# Patient Record
Sex: Male | Born: 1965 | Race: Black or African American | Hispanic: No | Marital: Single | Smoking: Never smoker
Health system: Southern US, Community
[De-identification: ages and names within clinical notes are randomized; demographics above are authoritative.]

## PROBLEM LIST (undated history)

## (undated) DIAGNOSIS — E785 Hyperlipidemia, unspecified: Secondary | ICD-10-CM

## (undated) DIAGNOSIS — I10 Essential (primary) hypertension: Secondary | ICD-10-CM

## (undated) DIAGNOSIS — R590 Localized enlarged lymph nodes: Secondary | ICD-10-CM

## (undated) DIAGNOSIS — F1490 Cocaine use, unspecified, uncomplicated: Secondary | ICD-10-CM

## (undated) DIAGNOSIS — F1010 Alcohol abuse, uncomplicated: Secondary | ICD-10-CM

## (undated) HISTORY — DX: Essential (primary) hypertension: I10

---

## 2011-04-04 ENCOUNTER — Emergency Department (HOSPITAL_COMMUNITY)
Admission: EM | Admit: 2011-04-04 | Discharge: 2011-04-04 | Disposition: A | Discharge status: Home / Self Care | Payer: Self-pay | Attending: Emergency Medicine | Admitting: Emergency Medicine

## 2011-04-04 DIAGNOSIS — J029 Acute pharyngitis, unspecified: Secondary | ICD-10-CM | POA: Insufficient documentation

## 2011-04-04 DIAGNOSIS — R059 Cough, unspecified: Secondary | ICD-10-CM | POA: Insufficient documentation

## 2011-04-04 DIAGNOSIS — R05 Cough: Secondary | ICD-10-CM | POA: Insufficient documentation

## 2011-04-04 DIAGNOSIS — R6889 Other general symptoms and signs: Secondary | ICD-10-CM | POA: Insufficient documentation

## 2012-11-22 ENCOUNTER — Telehealth: Payer: Self-pay | Admitting: Internal Medicine

## 2012-11-22 ENCOUNTER — Encounter (HOSPITAL_COMMUNITY): Payer: Self-pay | Admitting: *Deleted

## 2012-11-22 ENCOUNTER — Emergency Department (HOSPITAL_COMMUNITY)
Admission: EM | Admit: 2012-11-22 | Discharge: 2012-11-22 | Disposition: A | Discharge status: Home / Self Care | Payer: Self-pay | Attending: Emergency Medicine | Admitting: Emergency Medicine

## 2012-11-22 ENCOUNTER — Encounter: Payer: Self-pay | Admitting: Oncology

## 2012-11-22 ENCOUNTER — Emergency Department (HOSPITAL_COMMUNITY): Payer: Self-pay

## 2012-11-22 DIAGNOSIS — R079 Chest pain, unspecified: Secondary | ICD-10-CM | POA: Insufficient documentation

## 2012-11-22 DIAGNOSIS — R591 Generalized enlarged lymph nodes: Secondary | ICD-10-CM

## 2012-11-22 DIAGNOSIS — R141 Gas pain: Secondary | ICD-10-CM | POA: Insufficient documentation

## 2012-11-22 DIAGNOSIS — R599 Enlarged lymph nodes, unspecified: Secondary | ICD-10-CM | POA: Insufficient documentation

## 2012-11-22 DIAGNOSIS — R142 Eructation: Secondary | ICD-10-CM | POA: Insufficient documentation

## 2012-11-22 LAB — CBC
MCV: 82 fL (ref 78.0–100.0)
Platelets: 202 10*3/uL (ref 150–400)
RBC: 4.23 MIL/uL (ref 4.22–5.81)
WBC: 4 10*3/uL (ref 4.0–10.5)

## 2012-11-22 LAB — COMPREHENSIVE METABOLIC PANEL
ALT: 68 U/L — ABNORMAL HIGH (ref 0–53)
AST: 106 U/L — ABNORMAL HIGH (ref 0–37)
Alkaline Phosphatase: 59 U/L (ref 39–117)
CO2: 22 mEq/L (ref 19–32)
Chloride: 108 mEq/L (ref 96–112)
GFR calc non Af Amer: 82 mL/min — ABNORMAL LOW (ref >90)
Potassium: 3.3 mEq/L — ABNORMAL LOW (ref 3.5–5.1)
Sodium: 140 mEq/L (ref 135–145)
Total Bilirubin: 0.4 mg/dL (ref 0.3–1.2)

## 2012-11-22 LAB — POCT I-STAT TROPONIN I: Troponin i, poc: 0.02 ng/mL (ref 0.00–0.08)

## 2012-11-22 MED ORDER — GI COCKTAIL ~~LOC~~
30.0000 mL | Freq: Once | ORAL | Status: AC
  Administered 2012-11-22: 30 mL via ORAL
  Filled 2012-11-22: qty 30

## 2012-11-22 MED ORDER — ASPIRIN 81 MG PO CHEW: 324.0000 mg | CHEWABLE_TABLET | Freq: Once | ORAL | Status: DC

## 2012-11-22 MED ORDER — SODIUM CHLORIDE 0.9 % IV SOLN
1000.0000 mL | INTRAVENOUS | Status: DC
  Administered 2012-11-22: 1000 mL via INTRAVENOUS

## 2012-11-22 MED ORDER — IOHEXOL 300 MG/ML  SOLN
80.0000 mL | Freq: Once | INTRAMUSCULAR | Status: AC | PRN
  Administered 2012-11-22: 80 mL via INTRAVENOUS

## 2012-11-22 NOTE — Progress Notes (Signed)
Medical Oncology on call   Called by ED physician requesting assistance with outpatient evaluation and follow up of this 47 yo patient who has some mediastinal and hilar adenopathy by chest CT done thru ED today, does not have PCP, social worker assisting > 1 hour in ED not able to find any PCP options, patient stable for outpatient follow up per ED. Labs ok per ED. Radiology has suggested PET prior to considering biopsy, which ED cannot set up. He does not have diagnosis yet.    Due to reported lack of other options for management, I have asked [facility] new patient scheduler to try to set up new patient visit with any med onc MD in next few weeks and to let ED know. ED to keep patient there until they hear back re appointment.  L.Marg Macmaster,MD

## 2012-11-22 NOTE — Telephone Encounter (Signed)
Received note from Dr. Darrold Span stating to schedule a pt in the  ED a new pt appt. The np appt is 12/03/12@1 :30 with Dr. Arbutus Ped. The ED Dr  Will give pt time and date of appt. Dx- Hilar mediastinal adenopathy Mailed np packet

## 2012-11-22 NOTE — ED Notes (Signed)
Per EMS: pt coming from home with c/o bilateral chest pain, described as sharp, constant. Skin warm and dry, respirations equal and unlabored. Pain onset was during an unsuccessful bowel movement, inhalation increases pain. Pt now rate 1/10. Pt given 324 asa. Pt is A&Ox4.

## 2012-11-22 NOTE — Telephone Encounter (Signed)
C/D 11/22/12 for appt. 12/03/12

## 2012-11-22 NOTE — ED Provider Notes (Signed)
CT findings noted Pt will need f/u He denies known h/o cancer He denies wt loss, denies night sweats I have spoken to case management about arranging PCP followup and likely oncology referral  Pt agreeable with plan  Joya Gaskins, MD 11/22/12 [phone]

## 2012-11-22 NOTE — ED Provider Notes (Signed)
Date: 11/22/2012 0748am  Rate: 83  Rhythm: normal sinus rhythm  QRS Axis: normal  Intervals: normal  ST/T Wave abnormalities: nonspecific ST changes  Conduction Disutrbances:none  Narrative Interpretation:   Old EKG Reviewed: unchanged    I assumed care in signout Pt feels improved, no current CP Repeat EKG appears unchanged from prior His abdomen is soft, and he is resting comfortably Initial EDP felt patient should have CT chest Will obtain Ct chest, repeat troponin and if negative will be stable for d/c Pt denies pleuritic CP, doubt PE   Joya Gaskins, MD 11/22/12 [phone]

## 2012-11-22 NOTE — ED Provider Notes (Signed)
History    CSN: [id number] Arrival date & time 11/22/12  0102 First MD Initiated Contact with Patient 11/22/12 [phone]      Chief Complaint  Patient presents with  . Chest Pain    HPI Patient presents to emergency room with complaints of acute onset of chest pain this morning.  Patient states he started to feel gas, bloating in his abdomen. He then started having some sensation that he needed to burp. Following that he experienced severe sharp constant pain in his chest. Patient was given an aspirin the symptoms have improved. He denies any shortness of breath, nausea or vomiting. He denies any leg swelling. He denies any history of PE or DVT. There is no significant family history of heart disease. History reviewed. No pertinent past medical history.  No past surgical history on file.  No family history on file.  History  Substance Use Topics  . Smoking status: Not on file  . Smokeless tobacco: Not on file  . Alcohol Use: Not on file      Review of Systems  All other systems reviewed and are negative.    Allergies  Review of patient's allergies indicates no known allergies.  Home Medications  No current outpatient prescriptions on file.  BP 126/84  Pulse 81  Temp(Src) 97.7 F (36.5 C) (Oral)  Resp 25  SpO2 98%  Physical Exam  Nursing note and vitals reviewed. Constitutional: He appears well-developed and well-nourished. No distress.  HENT:  Head: Normocephalic and atraumatic.  Right Ear: External ear normal.  Left Ear: External ear normal.  Eyes: Conjunctivae are normal. Right eye exhibits no discharge. Left eye exhibits no discharge. No scleral icterus.  Neck: Neck supple. No tracheal deviation present.  Cardiovascular: Normal rate, regular rhythm and intact distal pulses.   Pulmonary/Chest: Effort normal and breath sounds normal. No stridor. No respiratory distress. He has no wheezes. He has no rales. He exhibits tenderness.  Abdominal: Soft. Bowel sounds are  normal. He exhibits no distension. There is no tenderness. There is no rebound and no guarding.  Musculoskeletal: He exhibits no edema and no tenderness.  Neurological: He is alert. He has normal strength. No sensory deficit. Cranial nerve deficit:  no gross defecits noted. He exhibits normal muscle tone. He displays no seizure activity. Coordination normal.  Skin: Skin is warm and dry. No rash noted.  Psychiatric: He has a normal mood and affect.    ED Course  Procedures (including critical care time) EKG Normal sinus rhythm Normal axis, normal intervals Borderline repolarization abnormality No prior EKG for comparison Labs Reviewed  CBC - Abnormal; Notable for the following:    Hemoglobin 11.9 (*)    HCT 34.7 (*)    All other components within normal limits  PROTIME-INR  APTT  COMPREHENSIVE METABOLIC PANEL  POCT I-STAT TROPONIN I   Dg Chest 2 View  11/22/2012   *RADIOLOGY REPORT*  Clinical Data: Chest pain  CHEST - 2 VIEW  Comparison: None.  Findings: Normal heart size and pulmonary vascularity.  Prominent nodular soft tissue opacities in the hilar regions bilaterally and in the left aortopulmonic window.  Changes suggest hilar lymphadenopathy.  CT is recommended for further evaluation.  No focal airspace consolidation in the lungs.  No blunting of costophrenic angles.  No pneumothorax.  IMPRESSION: Bilateral hilar prominence suggesting lymphadenopathy.  CT recommended for further evaluation.   Original Report Authenticated By: Burman Nieves, M.D.      MDM  Hilar lymphadenopathy noted on CXR.  Will  Ct to evaluate further .   Symptoms atypical for cardiac disease.  Will continue to monitor.       Celene Kras, MD 11/22/12 [phone]

## 2012-11-22 NOTE — ED Provider Notes (Signed)
F/u arranged on 6/24 with dr Shirline Frees with cancer center Pt informed of need for followup and possible cancer   Eric Gaskins, MD 11/22/12 [phone]

## 2012-12-03 ENCOUNTER — Telehealth: Payer: Self-pay | Admitting: Internal Medicine

## 2012-12-03 ENCOUNTER — Other Ambulatory Visit (HOSPITAL_BASED_OUTPATIENT_CLINIC_OR_DEPARTMENT_OTHER): Payer: Self-pay | Admitting: Lab

## 2012-12-03 ENCOUNTER — Ambulatory Visit: Payer: Self-pay

## 2012-12-03 ENCOUNTER — Encounter: Payer: Self-pay | Admitting: Internal Medicine

## 2012-12-03 ENCOUNTER — Ambulatory Visit (HOSPITAL_BASED_OUTPATIENT_CLINIC_OR_DEPARTMENT_OTHER): Payer: Self-pay | Admitting: Internal Medicine

## 2012-12-03 VITALS — BP 179/91 | HR 81 | Temp 98.3°F | Resp 18 | Ht 66.0 in | Wt 164.4 lb

## 2012-12-03 DIAGNOSIS — R599 Enlarged lymph nodes, unspecified: Secondary | ICD-10-CM

## 2012-12-03 DIAGNOSIS — R590 Localized enlarged lymph nodes: Secondary | ICD-10-CM | POA: Insufficient documentation

## 2012-12-03 LAB — COMPREHENSIVE METABOLIC PANEL (CC13)
ALT: 33 U/L (ref 0–55)
Albumin: 3.4 g/dL — ABNORMAL LOW (ref 3.5–5.0)
CO2: 26 mEq/L (ref 22–29)
Calcium: 8.9 mg/dL (ref 8.4–10.4)
Chloride: 105 mEq/L (ref 98–107)
Glucose: 105 mg/dl — ABNORMAL HIGH (ref 70–99)
Sodium: 139 mEq/L (ref 136–145)
Total Bilirubin: 0.35 mg/dL (ref 0.20–1.20)
Total Protein: 7.5 g/dL (ref 6.4–8.3)

## 2012-12-03 LAB — CBC WITH DIFFERENTIAL/PLATELET
Basophils Absolute: 0 10*3/uL (ref 0.0–0.1)
Eosinophils Absolute: 0.3 10*3/uL (ref 0.0–0.5)
HGB: 11.8 g/dL — ABNORMAL LOW (ref 13.0–17.1)
LYMPH%: 14.5 % (ref 14.0–49.0)
MCV: 84 fL (ref 79.3–98.0)
MONO#: 0.3 10*3/uL (ref 0.1–0.9)
MONO%: 6.5 % (ref 0.0–14.0)
NEUT#: 3.8 10*3/uL (ref 1.5–6.5)
Platelets: 236 10*3/uL (ref 140–400)
RDW: 12.6 % (ref 11.0–14.6)
WBC: 5.2 10*3/uL (ref 4.0–10.3)

## 2012-12-03 LAB — LACTATE DEHYDROGENASE (CC13): LDH: 186 U/L (ref 125–245)

## 2012-12-03 NOTE — Patient Instructions (Signed)
I will order a PET scan for further evaluation of the enlarged lymph nodes in your chest. Followup visit in 2 weeks.

## 2012-12-03 NOTE — Progress Notes (Signed)
Checked in new patient .didn't ask for POA/living will. He has no insurance, so I have him the ph#/flyer for checking for insurance. He wants communication via email.

## 2012-12-03 NOTE — Progress Notes (Signed)
[facility] [phone]   Fax:([phone]  CONSULT NOTE  REFERRING PHYSICIAN: Dr. Zadie Rhine  REASON FOR CONSULTATION:  47 years old African American male with mediastinal lymphadenopathy.  HPI Eric Thornton is a 47 y.o. male with no significant past medical history except for hypertension and dyslipidemia as well as history of alcohol and drug abuse. The patient presented to the emergency Department at [facility] on 11/22/2012 complaining of bilateral lower rib cage pain. During his evaluation and chest x-ray was performed and showed bilateral hilar prominence suggesting lymphadenopathy. This was followed by CT scan of the chest on 11/22/2012 and it showed perihilar lymphadenopathy which accounts for the plain film abnormality. Right hilar lymph node measures 18  mm. Left hilar lymph node conglomerate measures 19 mm. Right suprahilar lymph node measures 25 mm. Smaller lower paratracheal lymph nodes measure 14 mm. Subcarinal lymph node measures 21 mm. No supraclavicular lymphadenopathy identified. No axillary lymphadenopathy. Review of the lung parenchyma demonstrates no subpleural nodularity. Mild atelectasis at the right lung base. He was given aspirin at the emergency Department with improvement in his condition. He was referred to me today for evaluation and recommendation regarding the mediastinal lymphadenopathy. The patient denied having any significant shortness of breath, cough or hemoptysis. He has no more chest pain. He denied having any significant weight loss or night sweats. He has no nausea or vomiting, no fever or chills. Today he has no complaints. His family history unremarkable for any malignancy except for a sister.   PAST MEDICAL HISTORY: Significant only for hypertension and dyslipidemia. The patient denied having any history of diabetes mellitus, coronary arteries diseases or stroke.  FAMILY HISTORY: Mother had thyroid  disease, father had cardiac arrest and a sister with history of cancer.   Social History History  Substance Use Topics  . Smoking status: Never Smoker   . Smokeless tobacco: Not on file  . Alcohol Use: 0.6 oz/week    1 Cans of beer, 0 Shots of liquor, 0 Drinks containing 0.5 oz of alcohol per week   The patient is single and has no children. He works in the [employer] He smokes a few cigarettes every now and then. The patient has a history of alcohol abuse but quit 20 years ago. He also has a history of cocaine abuse last time he used it was last week.  Allergies: NKDA  CURRENT MEDICATION: None.   Review of Systems  A comprehensive review of systems was negative.  Physical Exam  KVQ:QVZDG, healthy, no distress, well nourished and well developed SKIN: skin color, texture, turgor are normal HEAD: Normocephalic, No masses, lesions, tenderness or abnormalities EYES: normal, PERRLA EARS: External ears normal OROPHARYNX:no exudate and no erythema  NECK: supple, no adenopathy LYMPH:  no palpable lymphadenopathy, no hepatosplenomegaly LUNGS: clear to auscultation  HEART: regular rate & rhythm, no murmurs and no gallops ABDOMEN:abdomen soft, non-tender, normal bowel sounds and no masses or organomegaly BACK: Back symmetric, no curvature. EXTREMITIES:no joint deformities, effusion, or inflammation, no edema, no skin discoloration  NEURO: alert & oriented x 3 with fluent speech, no focal motor/sensory deficits  PERFORMANCE STATUS: ECOG 0  LABORATORY DATA: Lab Results  Component Value Date   WBC 5.2 12/03/2012   HGB 11.8* 12/03/2012   HCT 35.8* 12/03/2012   MCV 84.0 12/03/2012   PLT 236 12/03/2012      Chemistry      Component Value Date/Time   NA 139 12/03/2012 1348  NA 140 11/22/2012 0620   K 3.7 12/03/2012 1348   K 3.3* 11/22/2012 0620   CL 105 12/03/2012 1348   CL 108 11/22/2012 0620   CO2 26 12/03/2012 1348   CO2 22 11/22/2012 0620   BUN 10.3 12/03/2012 1348   BUN 12  11/22/2012 0620   CREATININE 1.2 12/03/2012 1348   CREATININE 1.05 11/22/2012 0620      Component Value Date/Time   CALCIUM 8.9 12/03/2012 1348   CALCIUM 8.3* 11/22/2012 0620   ALKPHOS 75 12/03/2012 1348   ALKPHOS 59 11/22/2012 0620   AST 18 12/03/2012 1348   AST 106* 11/22/2012 0620   ALT 33 12/03/2012 1348   ALT 68* 11/22/2012 0620   BILITOT 0.35 12/03/2012 1348   BILITOT 0.4 11/22/2012 0620       RADIOGRAPHIC STUDIES: Dg Chest 2 View  11/22/2012   *RADIOLOGY REPORT*  Clinical Data: Chest pain  CHEST - 2 VIEW  Comparison: None.  Findings: Normal heart size and pulmonary vascularity.  Prominent nodular soft tissue opacities in the hilar regions bilaterally and in the left aortopulmonic window.  Changes suggest hilar lymphadenopathy.  CT is recommended for further evaluation.  No focal airspace consolidation in the lungs.  No blunting of costophrenic angles.  No pneumothorax.  IMPRESSION: Bilateral hilar prominence suggesting lymphadenopathy.  CT recommended for further evaluation.   Original Report Authenticated By: Burman Nieves, M.D.   Ct Chest W Contrast  11/22/2012   *RADIOLOGY REPORT*  Clinical Data: Chest pain, questionable lymphadenopathy Chest radiograph  CT CHEST WITH CONTRAST  Technique:  Multidetector CT imaging of the chest was performed following the standard protocol during bolus administration of intravenous contrast.  Contrast: 80mL OMNIPAQUE IOHEXOL 300 MG/ML  SOLN  Comparison: Chest radiograph 11/22/2012  Findings: There is indeed perihilar lymphadenopathy which accounts for the plain film abnormality.  Right hilar lymph node measures 18 mm (image 30).  Left hilar lymph node conglomerate measures 19 mm (image 29).  Right suprahilar lymph node measures 25 mm.  Smaller lower paratracheal lymph nodes measure 14 mm.  Subcarinal lymph node measures 21 mm.  No supraclavicular lymphadenopathy identified.  No axillary lymphadenopathy.  Review of the lung parenchyma demonstrates no subpleural  nodularity.  Mild atelectasis at the right lung base.  Airways are normal.  Limited view of the upper abdomen partially images the spleen which appears normal.  Adrenal glands are normal.  Partially imaged kidneys appear normal.  Limited view of the skeleton is unremarkable.  IMPRESSION:  1.  Symmetric hilar lymphadenopathy and mediastinal lymphadenopathy.  Differential includes a lymphoproliferative disorder such as lymphoma versus granulomatous disease (sarcoidosis). There are no pulmonary findings to suggest sarcoidosis. 2.  Spleen appears normal. 3.  Consider outpatient FDG PET scan for biopsy guidance and potential staging.   Original Report Authenticated By: Genevive Bi, M.D.    ASSESSMENT: This is a very pleasant 47 years old [place] American male presented with mediastinal lymphadenopathy seen on recent CT scan of the chest. The pathology is unclear at this point but this could be presentation for sarcoidosis, lymphoma or lung cancer.   PLAN: I have a lengthy discussion with the patient today about his current condition. I showed them the images of the CT scan of the chest and the mediastinal lymphadenopathy. The patient is currently asymptomatic. I recommended for him to have a PET scan performed for further evaluation of these mediastinal lymphadenopathy. If the PET scan is suspicious, I would consider referring the patient to cardiothoracic surgery or pulmonary  for tissue diagnosis. I would see the patient back for followup visit in 2 weeks for evaluation and discussion of his PET scan results. He was advised to call immediately if he has any concerning symptoms in the interval.  All questions were answered. The patient knows to call the clinic with any problems, questions or concerns. We can certainly see the patient much sooner if necessary.  Thank you so much for allowing me to participate in the care of Eric Thornton. I will continue to follow up the patient with you and assist  in his care.  I spent 30 minutes counseling the patient face to face. The total time spent in the appointment was 50 minutes.  Sully Manzi K. 12/03/2012, 5:54 PM

## 2012-12-03 NOTE — Telephone Encounter (Signed)
s.w. pt and advised on PET and 7.8.14 appt .Marland Kitchen..pt ok and aware

## 2012-12-09 ENCOUNTER — Encounter (HOSPITAL_COMMUNITY): Payer: Self-pay

## 2012-12-16 ENCOUNTER — Encounter (HOSPITAL_COMMUNITY)
Admission: RE | Admit: 2012-12-16 | Discharge: 2012-12-16 | Disposition: A | Discharge status: Home / Self Care | Payer: Self-pay | Source: Ambulatory Visit | Attending: Internal Medicine | Admitting: Internal Medicine

## 2012-12-16 ENCOUNTER — Encounter (HOSPITAL_COMMUNITY): Payer: Self-pay

## 2012-12-16 DIAGNOSIS — R599 Enlarged lymph nodes, unspecified: Secondary | ICD-10-CM | POA: Insufficient documentation

## 2012-12-16 DIAGNOSIS — R590 Localized enlarged lymph nodes: Secondary | ICD-10-CM

## 2012-12-16 LAB — GLUCOSE, CAPILLARY: Glucose-Capillary: 91 mg/dL (ref 70–99)

## 2012-12-16 MED ORDER — FLUDEOXYGLUCOSE F - 18 (FDG) INJECTION
19.4000 | Freq: Once | INTRAVENOUS | Status: AC | PRN
  Administered 2012-12-16: 19.4 via INTRAVENOUS

## 2012-12-17 ENCOUNTER — Ambulatory Visit: Payer: Self-pay | Admitting: Internal Medicine

## 2012-12-24 ENCOUNTER — Telehealth: Payer: Self-pay | Admitting: *Deleted

## 2012-12-24 NOTE — Telephone Encounter (Signed)
NOTIFIED DR.MOHAMED'S NURSE, STEPHANIE JOHNSON,RN.

## 2012-12-26 ENCOUNTER — Ambulatory Visit (HOSPITAL_BASED_OUTPATIENT_CLINIC_OR_DEPARTMENT_OTHER): Payer: Self-pay | Admitting: Internal Medicine

## 2012-12-26 ENCOUNTER — Encounter: Payer: Self-pay | Admitting: Internal Medicine

## 2012-12-26 ENCOUNTER — Telehealth: Payer: Self-pay | Admitting: Internal Medicine

## 2012-12-26 VITALS — BP 141/93 | HR 63 | Temp 97.6°F | Resp 18 | Ht 66.0 in | Wt 160.9 lb

## 2012-12-26 DIAGNOSIS — R590 Localized enlarged lymph nodes: Secondary | ICD-10-CM

## 2012-12-26 DIAGNOSIS — R599 Enlarged lymph nodes, unspecified: Secondary | ICD-10-CM

## 2012-12-26 NOTE — Progress Notes (Signed)
[facility] Telephone:(336) [phone]   Fax:([phone]  OFFICE PROGRESS NOTE  No PCP Per Patient [address] [place] [address]  DIAGNOSIS: Lymphadenopathy suspicious for lymphoma.  PRIOR THERAPY: None  CURRENT THERAPY: None  INTERVAL HISTORY: Eric Thornton 47 y.o. male returns to the clinic today for followup visit. The patient is feeling fine today with no specific complaints. He denied having any significant fatigue or weakness. He denied having any chest pain, shortness breath, cough or hemoptysis. The patient has no nausea or vomiting. He was seen recently for mediastinal lymphadenopathy and that ordered a PET scan for further evaluation of his disease. The patient is here today for evaluation and discussion of his scan results.  ALLERGIES:  has No Known Allergies.  MEDICATIONS:  No current outpatient prescriptions on file.   No current facility-administered medications for this visit.    REVIEW OF SYSTEMS:  A comprehensive review of systems was negative.   PHYSICAL EXAMINATION: General appearance: alert, cooperative and no distress Head: Normocephalic, without obvious abnormality, atraumatic Neck: no adenopathy and thyroid not enlarged, symmetric, no tenderness/mass/nodules Lymph nodes: Cervical, supraclavicular, and axillary nodes normal. Resp: clear to auscultation bilaterally Cardio: regular rate and rhythm, S1, S2 normal, no murmur, click, rub or gallop GI: soft, non-tender; bowel sounds normal; no masses,  no organomegaly Extremities: extremities normal, atraumatic, no cyanosis or edema  ECOG PERFORMANCE STATUS: 1 - Symptomatic but completely ambulatory  Blood pressure 141/93, pulse 63, temperature 97.6 F (36.4 C), temperature source Oral, resp. rate 18, height 5\' 6"  (1.676 m), weight 160 lb 14.4 oz (72.984 kg).  LABORATORY DATA: Lab Results  Component Value Date   WBC 5.2 12/03/2012   HGB 11.8* 12/03/2012   HCT 35.8* 12/03/2012    MCV 84.0 12/03/2012   PLT 236 12/03/2012      Chemistry      Component Value Date/Time   NA 139 12/03/2012 1348   NA 140 11/22/2012 0620   K 3.7 12/03/2012 1348   K 3.3* 11/22/2012 0620   CL 105 12/03/2012 1348   CL 108 11/22/2012 0620   CO2 26 12/03/2012 1348   CO2 22 11/22/2012 0620   BUN 10.3 12/03/2012 1348   BUN 12 11/22/2012 0620   CREATININE 1.2 12/03/2012 1348   CREATININE 1.05 11/22/2012 0620      Component Value Date/Time   CALCIUM 8.9 12/03/2012 1348   CALCIUM 8.3* 11/22/2012 0620   ALKPHOS 75 12/03/2012 1348   ALKPHOS 59 11/22/2012 0620   AST 18 12/03/2012 1348   AST 106* 11/22/2012 0620   ALT 33 12/03/2012 1348   ALT 68* 11/22/2012 0620   BILITOT 0.35 12/03/2012 1348   BILITOT 0.4 11/22/2012 0620       RADIOGRAPHIC STUDIES: Nm Pet Image Initial (pi) Skull Base To Thigh  12/16/2012   *RADIOLOGY REPORT*  Clinical Data: Initial treatment strategy for mediastinal lymphadenopathy. Evaluate for lung cancer versus lymphoma versus sarcoid.  NUCLEAR MEDICINE PET SKULL BASE TO THIGH  Fasting Blood Glucose:  91  Technique:  19.4 mCi F-18 FDG was injected intravenously.  CT data was obtained and used for attenuation correction and anatomic localization only.  (This was not acquired as a diagnostic CT examination.) Additional exam technical data entered on technologist worksheet.  Comparison:  CT chest dated 11/22/2012  Findings:  Neck: Enlarged bilateral cervical lymph nodes, including: --12 mm short-axis left level IB node (series 2/image 33) --11 mm short-axis right level IB node (series  2/image 37) --7 mm short-axis right level IIA node (series 2/image 28), max SUV 5.1  Diffuse uptake within the nasal cavity, nasopharynx, bilateral parotid, and bilateral submandibular glands, likely physiologic/reactive.  Chest:  No suspicious pulmonary nodules on the CT scan.  No pulmonary findings to suggest sarcoidosis.  Extensive thoracic lymphadenopathy, including: --14 mm short-axis left axillary node  (series 2/image 63), max SUV 6.8 --13 mm short-axis right axillary node (series 2/image 69), max SUV 4.0) --15 mm short-axis prevascular node (series 2/image 71), max SUV 8.3 --Right hilar lymphadenopathy, max SUV 8.2 (PET image 72) --15 mm short-axis right subcarinal/lower lobe node (series 2/image 81), max SUV 8.4  Abdomen/Pelvis:  No abnormal hypermetabolic activity within the liver, pancreas, adrenal glands, or spleen.  Enlarged pelvic lymph nodes, including: --10 mm short-axis right deep inguinal node (series 2/image 192), max SUV 4.6 --14 mm short-axis left external iliac node (series 2/image 198), max SUV 6.8 --17 mm short-axis right lower inguinal node (series 2/image 219), max SUV 10.1 --13 mm short-axis left lower inguinal node (series 2/image 224), max SUV 11.3  Skeleton:  No focal hypermetabolic activity to suggest skeletal metastasis.  IMPRESSION: Hypermetabolic cervical, thoracic, and pelvic lymphadenopathy, as described above, max SUV 11.3 in the left lower inguinal region.  No pulmonary findings to suggest primary bronchogenic neoplasm or sarcoidosis.  The overall appearance is worrisome for lymphoproliferative disorder such as lymphoma.  Percutaneous biopsy is suggested.   Original Report Authenticated By: Charline Bills, M.D.    ASSESSMENT AND PLAN: This is a very pleasant 47 years old African American male with generalized lymphadenopathy suspicious for lymphoma. I discussed the PET scan results with the patient and recommended for him referral to Dr. Dwain Sarna for consideration of excisional biopsy of one of the enlarged inguinal lymph nodes. I would see him back for followup visit in one month for evaluation and discussion of his biopsy results as well as treatment options. The patient agreed to the current plan He was advised to call immediately if he has any concerning symptoms in the interval.   All questions were answered. The patient knows to call the clinic with any problems,  questions or concerns. We can certainly see the patient much sooner if necessary.

## 2012-12-26 NOTE — Telephone Encounter (Signed)
Gave pt appt for lab and ML in August and see Dr. Dwain Sarna @ [facility] on 7/28, pt to bring payment since he did not have insurance

## 2012-12-28 NOTE — Patient Instructions (Signed)
PET scan showed generalized lymphadenopathy suspicious for lymphoma. I will refer to surgery for consideration of excisional biopsy of one of the lymph nodes. Followup visit in one month

## 2013-01-02 ENCOUNTER — Telehealth: Payer: Self-pay | Admitting: Medical Oncology

## 2013-01-02 NOTE — Telephone Encounter (Signed)
Errick called and asked me to contact his sister Senan Urey , [phone] ( she is in [place]) and give her access to his medical information and for what Dr Arbutus Ped is seeing him . I called Renee and gave her information .

## 2013-01-06 ENCOUNTER — Ambulatory Visit (INDEPENDENT_AMBULATORY_CARE_PROVIDER_SITE_OTHER): Payer: Self-pay | Admitting: General Surgery

## 2013-01-06 ENCOUNTER — Telehealth (INDEPENDENT_AMBULATORY_CARE_PROVIDER_SITE_OTHER): Payer: Self-pay | Admitting: General Surgery

## 2013-01-06 ENCOUNTER — Encounter (INDEPENDENT_AMBULATORY_CARE_PROVIDER_SITE_OTHER): Payer: Self-pay | Admitting: General Surgery

## 2013-01-06 VITALS — BP 130/78 | HR 82 | Temp 97.4°F | Resp 16 | Ht 65.5 in | Wt 157.4 lb

## 2013-01-06 DIAGNOSIS — R599 Enlarged lymph nodes, unspecified: Secondary | ICD-10-CM

## 2013-01-06 DIAGNOSIS — R590 Localized enlarged lymph nodes: Secondary | ICD-10-CM

## 2013-01-06 NOTE — Progress Notes (Signed)
Patient ID: Eric Thornton, male   DOB: 1965/10/07, 47 y.o.   MRN: [id number]  Chief Complaint  Patient presents with  . New Evaluation    eval cervical lymphadenopathy    HPI Eric Thornton is a 47 y.o. male.  Referred by Dr Arbutus Ped HPI 64 yom who went to ER with bilateral rib pain and was found to have hilar adenopathy.  He has undergone evaluation by Dr Arbutus Ped and is found to have several sites of adenopathy.  The rib pain is resolved. He has no other symptoms.  I went through ROS with him.  Specifically he does not notice any masses, no weight loss, no night sweats, no loss of appetite.  Has fh of sister with unknown cancer and gm with breast cancer.  Past Medical History  Diagnosis Date  . Hypertension     History reviewed. No pertinent past surgical history.  Family History  Problem Relation Age of Onset  . Cancer Sister     Social History History  Substance Use Topics  . Smoking status: Never Smoker   . Smokeless tobacco: Never Used  . Alcohol Use: 0.6 oz/week    1 Cans of beer, 0 Shots of liquor, 0 Drinks containing 0.5 oz of alcohol per week     Comment: last one month ago    No Known Allergies  No current outpatient prescriptions on file.   No current facility-administered medications for this visit.    Review of Systems Review of Systems  Constitutional: Negative for fever, chills and unexpected weight change.  HENT: Negative for hearing loss, congestion, sore throat, trouble swallowing and voice change.   Eyes: Negative for visual disturbance.  Respiratory: Negative for cough and wheezing.   Cardiovascular: Negative for chest pain, palpitations and leg swelling.  Gastrointestinal: Negative for nausea, vomiting, abdominal pain, diarrhea, constipation, blood in stool, abdominal distention, anal bleeding and rectal pain.  Genitourinary: Negative for hematuria and difficulty urinating.  Musculoskeletal: Negative for arthralgias.  Skin: Negative for rash  and wound.  Neurological: Negative for seizures, syncope, weakness and headaches.  Hematological: Negative for adenopathy. Does not bruise/bleed easily.  Psychiatric/Behavioral: Negative for confusion.    Blood pressure 130/78, pulse 82, temperature 97.4 F (36.3 C), temperature source Temporal, resp. rate 16, height 5' 5.5" (1.664 m), weight 157 lb 6.4 oz (71.396 kg).  Physical Exam Physical Exam  Vitals reviewed. Constitutional: He appears well-developed and well-nourished.  Cardiovascular: Normal rate, regular rhythm and normal heart sounds.   Pulmonary/Chest: Effort normal and breath sounds normal. He has no wheezes. He has no rales.  Lymphadenopathy:    He has no cervical adenopathy.       Right: Inguinal adenopathy present.       Left: Inguinal adenopathy present.    Data Reviewed NUCLEAR MEDICINE PET SKULL BASE TO THIGH  Fasting Blood Glucose: 91  Technique: 19.4 mCi F-18 FDG was injected intravenously. CT data  was obtained and used for attenuation correction and anatomic  localization only. (This was not acquired as a diagnostic CT  examination.) Additional exam technical data entered on  technologist worksheet.  Comparison: CT chest dated 11/22/2012  Findings:  Neck: Enlarged bilateral cervical lymph nodes, including:  --12 mm short-axis left level IB node (series 2/image 33)  --11 mm short-axis right level IB node (series 2/image 37)  --7 mm short-axis right level IIA node (series 2/image 28), max SUV  5.1  Diffuse uptake within the nasal cavity, nasopharynx, bilateral  parotid, and bilateral submandibular  glands, likely  physiologic/reactive.  Chest: No suspicious pulmonary nodules on the CT scan. No  pulmonary findings to suggest sarcoidosis.  Extensive thoracic lymphadenopathy, including:  --14 mm short-axis left axillary node (series 2/image 63), max SUV  6.8  --13 mm short-axis right axillary node (series 2/image 69), max SUV  4.0)  --15 mm short-axis  prevascular node (series 2/image 71), max SUV  8.3  --Right hilar lymphadenopathy, max SUV 8.2 (PET image 72)  --15 mm short-axis right subcarinal/lower lobe node (series 2/image  81), max SUV 8.4  Abdomen/Pelvis: No abnormal hypermetabolic activity within the  liver, pancreas, adrenal glands, or spleen.  Enlarged pelvic lymph nodes, including:  --10 mm short-axis right deep inguinal node (series 2/image 192),  max SUV 4.6  --14 mm short-axis left external iliac node (series 2/image 198),  max SUV 6.8  --17 mm short-axis right lower inguinal node (series 2/image 219),  max SUV 10.1  --13 mm short-axis left lower inguinal node (series 2/image 224),  max SUV 11.3  Skeleton: No focal hypermetabolic activity to suggest skeletal  metastasis.  IMPRESSION:  Hypermetabolic cervical, thoracic, and pelvic lymphadenopathy, as  described above, max SUV 11.3 in the left lower inguinal region.  No pulmonary findings to suggest primary bronchogenic neoplasm or  sarcoidosis.  The overall appearance is worrisome for lymphoproliferative  disorder such as lymphoma. Percutaneous biopsy is suggested.    Assessment    Multiple sites of lymphadenopathy    Plan    Right inguinal node excisional biopsy     I discussed with him a right inguinal node excisional biopsy. This is the most accessible place as I can palpate this node. He has no other real symptoms associated with this right now. I think due to the appearance of a PET scan as well as a multiple size it is reasonable to consider this. I discussed doing this under general anesthesia. I discussed bleeding, infection, postoperative lymphatic leak as the risks involved with this procedure. We will plan on doing this as soon as we can.   Deedra Pro 01/06/2013, 10:44 AM

## 2013-01-06 NOTE — Telephone Encounter (Signed)
Will wait to hear back from patient. I recommended right inguinal node biopsy

## 2013-01-06 NOTE — Telephone Encounter (Signed)
Advised patient of financial responsibility, per patient he will call back to schedule

## 2013-01-28 ENCOUNTER — Other Ambulatory Visit: Payer: Self-pay | Admitting: Lab

## 2013-01-28 ENCOUNTER — Ambulatory Visit: Payer: Self-pay | Admitting: Internal Medicine

## 2015-11-30 ENCOUNTER — Emergency Department (HOSPITAL_COMMUNITY): Payer: No Typology Code available for payment source

## 2015-11-30 ENCOUNTER — Encounter (HOSPITAL_COMMUNITY): Payer: Self-pay

## 2015-11-30 ENCOUNTER — Emergency Department (HOSPITAL_COMMUNITY)
Admission: EM | Admit: 2015-11-30 | Discharge: 2015-11-30 | Disposition: A | Discharge status: Home / Self Care | Payer: No Typology Code available for payment source | Attending: Emergency Medicine | Admitting: Emergency Medicine

## 2015-11-30 DIAGNOSIS — Y999 Unspecified external cause status: Secondary | ICD-10-CM | POA: Insufficient documentation

## 2015-11-30 DIAGNOSIS — Y92410 Unspecified street and highway as the place of occurrence of the external cause: Secondary | ICD-10-CM | POA: Insufficient documentation

## 2015-11-30 DIAGNOSIS — I10 Essential (primary) hypertension: Secondary | ICD-10-CM | POA: Insufficient documentation

## 2015-11-30 DIAGNOSIS — S46911A Strain of unspecified muscle, fascia and tendon at shoulder and upper arm level, right arm, initial encounter: Secondary | ICD-10-CM | POA: Insufficient documentation

## 2015-11-30 DIAGNOSIS — Y9389 Activity, other specified: Secondary | ICD-10-CM | POA: Diagnosis not present

## 2015-11-30 DIAGNOSIS — M25511 Pain in right shoulder: Secondary | ICD-10-CM | POA: Diagnosis present

## 2015-11-30 MED ORDER — NAPROXEN 500 MG PO TABS: 500.0000 mg | ORAL_TABLET | Freq: Two times a day (BID) | ORAL | Status: DC | PRN
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
## 2018-03-12 ENCOUNTER — Encounter (HOSPITAL_COMMUNITY): Payer: Self-pay | Admitting: Emergency Medicine

## 2018-03-12 ENCOUNTER — Other Ambulatory Visit: Payer: Self-pay

## 2018-03-12 ENCOUNTER — Inpatient Hospital Stay (HOSPITAL_COMMUNITY)
Admission: EM | Admit: 2018-03-12 | Discharge: 2018-03-15 | DRG: 419 | Disposition: A | Discharge status: Home / Self Care | Payer: Self-pay | Attending: Internal Medicine | Admitting: Internal Medicine

## 2018-03-12 ENCOUNTER — Emergency Department (HOSPITAL_COMMUNITY): Payer: Self-pay

## 2018-03-12 DIAGNOSIS — R590 Localized enlarged lymph nodes: Secondary | ICD-10-CM | POA: Diagnosis present

## 2018-03-12 DIAGNOSIS — I10 Essential (primary) hypertension: Secondary | ICD-10-CM

## 2018-03-12 DIAGNOSIS — I200 Unstable angina: Secondary | ICD-10-CM

## 2018-03-12 DIAGNOSIS — K810 Acute cholecystitis: Secondary | ICD-10-CM

## 2018-03-12 DIAGNOSIS — R52 Pain, unspecified: Secondary | ICD-10-CM

## 2018-03-12 DIAGNOSIS — I160 Hypertensive urgency: Secondary | ICD-10-CM | POA: Diagnosis present

## 2018-03-12 DIAGNOSIS — K8020 Calculus of gallbladder without cholecystitis without obstruction: Secondary | ICD-10-CM | POA: Diagnosis present

## 2018-03-12 DIAGNOSIS — R001 Bradycardia, unspecified: Secondary | ICD-10-CM | POA: Diagnosis present

## 2018-03-12 DIAGNOSIS — I1310 Hypertensive heart and chronic kidney disease without heart failure, with stage 1 through stage 4 chronic kidney disease, or unspecified chronic kidney disease: Secondary | ICD-10-CM | POA: Diagnosis present

## 2018-03-12 DIAGNOSIS — K8001 Calculus of gallbladder with acute cholecystitis with obstruction: Principal | ICD-10-CM | POA: Diagnosis present

## 2018-03-12 DIAGNOSIS — N182 Chronic kidney disease, stage 2 (mild): Secondary | ICD-10-CM | POA: Diagnosis present

## 2018-03-12 DIAGNOSIS — E785 Hyperlipidemia, unspecified: Secondary | ICD-10-CM | POA: Diagnosis present

## 2018-03-12 DIAGNOSIS — Z9119 Patient's noncompliance with other medical treatment and regimen: Secondary | ICD-10-CM

## 2018-03-12 DIAGNOSIS — R079 Chest pain, unspecified: Secondary | ICD-10-CM | POA: Diagnosis present

## 2018-03-12 DIAGNOSIS — Z8249 Family history of ischemic heart disease and other diseases of the circulatory system: Secondary | ICD-10-CM

## 2018-03-12 DIAGNOSIS — F1490 Cocaine use, unspecified, uncomplicated: Secondary | ICD-10-CM | POA: Diagnosis present

## 2018-03-12 DIAGNOSIS — F1410 Cocaine abuse, uncomplicated: Secondary | ICD-10-CM | POA: Diagnosis present

## 2018-03-12 DIAGNOSIS — I161 Hypertensive emergency: Secondary | ICD-10-CM

## 2018-03-12 DIAGNOSIS — R1013 Epigastric pain: Secondary | ICD-10-CM | POA: Diagnosis present

## 2018-03-12 HISTORY — DX: Alcohol abuse, uncomplicated: F10.10

## 2018-03-12 HISTORY — DX: Localized enlarged lymph nodes: R59.0

## 2018-03-12 HISTORY — DX: Hyperlipidemia, unspecified: E78.5

## 2018-03-12 HISTORY — DX: Cocaine use, unspecified, uncomplicated: F14.90

## 2018-03-12 LAB — PROTIME-INR
INR: 0.91
Prothrombin Time: 12.2 seconds (ref 11.4–15.2)

## 2018-03-12 LAB — COMPREHENSIVE METABOLIC PANEL
ALBUMIN: 3.8 g/dL (ref 3.5–5.0)
ALT: 28 U/L (ref 0–44)
ANION GAP: 11 (ref 5–15)
AST: 30 U/L (ref 15–41)
Alkaline Phosphatase: 62 U/L (ref 38–126)
BILIRUBIN TOTAL: 0.6 mg/dL (ref 0.3–1.2)
BUN: 10 mg/dL (ref 6–20)
CO2: 24 mmol/L (ref 22–32)
Calcium: 9.2 mg/dL (ref 8.9–10.3)
Chloride: 104 mmol/L (ref 98–111)
Creatinine, Ser: 1.37 mg/dL — ABNORMAL HIGH (ref 0.61–1.24)
GFR, EST NON AFRICAN AMERICAN: 57 mL/min — AB (ref >60)
Glucose, Bld: 98 mg/dL (ref 70–99)
POTASSIUM: 4.4 mmol/L (ref 3.5–5.1)
Sodium: 139 mmol/L (ref 135–145)
Total Protein: 7.9 g/dL (ref 6.5–8.1)

## 2018-03-12 LAB — APTT: APTT: 30 s (ref 24–36)

## 2018-03-12 LAB — LIPID PANEL
CHOL/HDL RATIO: 4.8 ratio
Cholesterol: 195 mg/dL (ref 0–200)
HDL: 41 mg/dL (ref >40)
LDL Cholesterol: 131 mg/dL — ABNORMAL HIGH (ref 0–99)
TRIGLYCERIDES: 113 mg/dL (ref <150)
VLDL: 23 mg/dL (ref 0–40)

## 2018-03-12 LAB — CBC
HEMATOCRIT: 42 % (ref 39.0–52.0)
HEMOGLOBIN: 13.3 g/dL (ref 13.0–17.0)
MCH: 27.9 pg (ref 26.0–34.0)
MCHC: 31.7 g/dL (ref 30.0–36.0)
MCV: 88.2 fL (ref 78.0–100.0)
Platelets: 267 10*3/uL (ref 150–400)
RBC: 4.76 MIL/uL (ref 4.22–5.81)
RDW: 12.1 % (ref 11.5–15.5)
WBC: 3.7 10*3/uL — AB (ref 4.0–10.5)

## 2018-03-12 LAB — I-STAT CHEM 8, ED
BUN: 10 mg/dL (ref 6–20)
Calcium, Ion: 1.02 mmol/L — ABNORMAL LOW (ref 1.15–1.40)
Chloride: 103 mmol/L (ref 98–111)
Creatinine, Ser: 1.3 mg/dL — ABNORMAL HIGH (ref 0.61–1.24)
Glucose, Bld: 107 mg/dL — ABNORMAL HIGH (ref 70–99)
HEMATOCRIT: 39 % (ref 39.0–52.0)
HEMOGLOBIN: 13.3 g/dL (ref 13.0–17.0)
Potassium: 3.5 mmol/L (ref 3.5–5.1)
Sodium: 140 mmol/L (ref 135–145)
TCO2: 25 mmol/L (ref 22–32)

## 2018-03-12 LAB — URINALYSIS, ROUTINE W REFLEX MICROSCOPIC
Bilirubin Urine: NEGATIVE
Glucose, UA: NEGATIVE mg/dL
Hgb urine dipstick: NEGATIVE
Ketones, ur: NEGATIVE mg/dL
LEUKOCYTES UA: NEGATIVE
NITRITE: NEGATIVE
PH: 7 (ref 5.0–8.0)
Protein, ur: NEGATIVE mg/dL
SPECIFIC GRAVITY, URINE: 1.013 (ref 1.005–1.030)

## 2018-03-12 LAB — LIPASE, BLOOD: Lipase: 45 U/L (ref 11–51)

## 2018-03-12 LAB — I-STAT TROPONIN, ED: TROPONIN I, POC: 0.01 ng/mL (ref 0.00–0.08)

## 2018-03-12 LAB — TROPONIN I

## 2018-03-12 MED ORDER — ENOXAPARIN SODIUM 40 MG/0.4ML ~~LOC~~ SOLN
40.0000 mg | SUBCUTANEOUS | Status: DC
  Administered 2018-03-13 – 2018-03-14 (×2): 40 mg via SUBCUTANEOUS
  Filled 2018-03-12 (×2): qty 0.4

## 2018-03-12 MED ORDER — NITROGLYCERIN 0.4 MG SL SUBL: 0.4000 mg | SUBLINGUAL_TABLET | SUBLINGUAL | Status: DC | PRN

## 2018-03-12 MED ORDER — ACETAMINOPHEN 650 MG RE SUPP: 650.0000 mg | Freq: Four times a day (QID) | RECTAL | Status: DC | PRN

## 2018-03-12 MED ORDER — HYDRALAZINE HCL 20 MG/ML IJ SOLN: 5.0000 mg | INTRAMUSCULAR | Status: DC | PRN

## 2018-03-12 MED ORDER — SODIUM CHLORIDE 0.9 % IV SOLN
INTRAVENOUS | Status: DC
  Administered 2018-03-12 – 2018-03-13 (×2): via INTRAVENOUS

## 2018-03-12 MED ORDER — HEPARIN SODIUM (PORCINE) 5000 UNIT/ML IJ SOLN
4000.0000 [IU] | Freq: Once | INTRAMUSCULAR | Status: AC
  Administered 2018-03-12: 4000 [IU] via INTRAVENOUS
  Filled 2018-03-12: qty 1

## 2018-03-12 MED ORDER — AMLODIPINE BESYLATE 10 MG PO TABS
10.0000 mg | ORAL_TABLET | Freq: Every day | ORAL | Status: DC
  Administered 2018-03-13 – 2018-03-15 (×4): 10 mg via ORAL
  Filled 2018-03-12: qty 1
  Filled 2018-03-12 (×3): qty 2

## 2018-03-12 MED ORDER — ZOLPIDEM TARTRATE 5 MG PO TABS: 5.0000 mg | ORAL_TABLET | Freq: Every evening | ORAL | Status: DC | PRN

## 2018-03-12 MED ORDER — HYDRALAZINE HCL 20 MG/ML IJ SOLN
10.0000 mg | Freq: Once | INTRAMUSCULAR | Status: AC
  Administered 2018-03-13: 10 mg via INTRAVENOUS
  Filled 2018-03-12: qty 1

## 2018-03-12 MED ORDER — ONDANSETRON HCL 4 MG/2ML IJ SOLN: 4.0000 mg | Freq: Four times a day (QID) | INTRAMUSCULAR | Status: DC | PRN

## 2018-03-12 MED ORDER — MORPHINE SULFATE (PF) 2 MG/ML IV SOLN: 2.0000 mg | INTRAVENOUS | Status: DC | PRN

## 2018-03-12 MED ORDER — SENNOSIDES-DOCUSATE SODIUM 8.6-50 MG PO TABS: 1.0000 | ORAL_TABLET | Freq: Every evening | ORAL | Status: DC | PRN

## 2018-03-12 MED ORDER — GI COCKTAIL ~~LOC~~
30.0000 mL | Freq: Once | ORAL | Status: AC
  Administered 2018-03-12: 30 mL via ORAL
  Filled 2018-03-12: qty 30

## 2018-03-12 MED ORDER — ASPIRIN 81 MG PO CHEW
324.0000 mg | CHEWABLE_TABLET | Freq: Once | ORAL | Status: AC
  Administered 2018-03-12: 324 mg via ORAL
  Filled 2018-03-12: qty 4

## 2018-03-12 MED ORDER — ASPIRIN 81 MG PO CHEW
324.0000 mg | CHEWABLE_TABLET | Freq: Every day | ORAL | Status: DC
  Administered 2018-03-13: 324 mg via ORAL
  Filled 2018-03-12: qty 4

## 2018-03-12 MED ORDER — SODIUM CHLORIDE 0.9 % IV SOLN
INTRAVENOUS | Status: DC
  Administered 2018-03-12: 18:00:00 via INTRAVENOUS

## 2018-03-12 MED ORDER — ONDANSETRON HCL 4 MG PO TABS: 4.0000 mg | ORAL_TABLET | Freq: Four times a day (QID) | ORAL | Status: DC | PRN

## 2018-03-12 MED ORDER — PANTOPRAZOLE SODIUM 40 MG PO TBEC
40.0000 mg | DELAYED_RELEASE_TABLET | Freq: Every day | ORAL | Status: DC
  Administered 2018-03-13: 40 mg via ORAL
  Filled 2018-03-12: qty 1

## 2018-03-12 MED ORDER — ACETAMINOPHEN 325 MG PO TABS: 650.0000 mg | ORAL_TABLET | Freq: Four times a day (QID) | ORAL | Status: DC | PRN

## 2018-03-12 MED ORDER — BISMUTH SUBSALICYLATE 262 MG/15ML PO SUSP
30.0000 mL | Freq: Four times a day (QID) | ORAL | Status: DC | PRN
  Filled 2018-03-12: qty 236

## 2018-03-12 NOTE — ED Notes (Signed)
Code Stemi cancelled @ [id number]

## 2018-03-12 NOTE — ED Provider Notes (Signed)
[facility] EMERGENCY DEPARTMENT Provider Note   CSN: [id number] Arrival date & time: 03/12/18  1658     History   Chief Complaint Chief Complaint  Patient presents with  . Abdominal Pain    HPI Eric Thornton is a 52 y.o. male.  HPI Patient presented with chest pain.  Began reportedly around 1:00.  In his mid chest.  Pressure.  States it is felt like this before he has had some constipation began after he ate some food.  No abdominal pain however.  No relief with Pepto-Bismol.  Has had some burping.  No cardiac history.  Does have history of hypertension.  Does smoke crack and smoked 2 days ago. Past Medical History:  Diagnosis Date  . Hypertension     Patient Active Problem List   Diagnosis Date Noted  . Chest pain 03/12/2018  . 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 Hypertensive urgency 03/12/2018  . Cocaine abuse (HCC) 03/12/2018  . Hypertension   . Lymphadenopathy, mediastinal 12/03/2012    History reviewed. No pertinent surgical history.      Home Medications    Prior to Admission medications   Medication Sig Start Date End Date Taking? Authorizing Provider  bismuth subsalicylate (PEPTO BISMOL) 262 MG/15ML suspension Take 30 mLs by mouth every 6 (six) hours as needed for indigestion.   Yes [provider]  naproxen (NAPROSYN) 500 MG tablet Take 1 tablet (500 mg total) by mouth 2 (two) times daily as needed for mild pain, moderate pain or headache (TAKE WITH MEALS.). Patient not taking: Reported on 03/12/2018 11/30/15   Street, [place], PA-C    Family History Family History  Problem Relation Age of Onset  . Cancer Sister     Social History Social History   Tobacco Use  . Smoking status: Never Smoker  . Smokeless tobacco: Never Used  Substance Use Topics  . Alcohol use: Yes    Alcohol/week: 1.0 standard drinks    Types: 1 Cans of beer per week    Comment: last one month ago  . Drug use: Yes    Types:  Cocaine     Allergies   Patient has no known allergies.   Review of Systems Review of Systems  Constitutional: Negative for appetite change.  HENT: Negative for congestion.   Respiratory: Negative for shortness of breath.   Cardiovascular: Positive for chest pain.  Gastrointestinal: Negative for abdominal pain, nausea and vomiting.  Genitourinary: Negative for flank pain.  Musculoskeletal: Negative for back pain.  Skin: Negative for rash.  Neurological: Negative for weakness.  Psychiatric/Behavioral: Negative for confusion.     Physical Exam Updated Vital Signs BP (!) 189/94   Pulse (!) 51   Temp 97.6 F (36.4 C) (Oral)   Resp (!) 24   Ht 5\' 6"  (1.676 m)   Wt 68 kg   SpO2 99%   BMI 24.21 kg/m   Physical Exam  Constitutional: He appears well-developed.  HENT:  Head: Atraumatic.  Cardiovascular: Regular rhythm.  Abdominal: Normal appearance. There is no tenderness.  Neurological: He is alert.  Skin: Skin is warm. Capillary refill takes less than 2 seconds.     ED Treatments / Results  Labs (all labs ordered are listed, but only abnormal results are displayed) Labs Reviewed  COMPREHENSIVE METABOLIC PANEL - Abnormal; Notable for the following components:      Result Value   Creatinine, Ser 1.37 (*)    GFR calc non Af Amer 57 (*)  All other components within normal limits  CBC - Abnormal; Notable for the following components:   WBC 3.7 (*)    All other components within normal limits  URINALYSIS, ROUTINE W REFLEX MICROSCOPIC - Abnormal; Notable for the following components:   Color, Urine STRAW (*)    All other components within normal limits  LIPID PANEL - Abnormal; Notable for the following components:   LDL Cholesterol 131 (*)    All other components within normal limits  I-STAT CHEM 8, ED - Abnormal; Notable for the following components:   Creatinine, Ser 1.30 (*)    Glucose, Bld 107 (*)    Calcium, Ion 1.02 (*)    All other components within  normal limits  LIPASE, BLOOD  TROPONIN I  APTT  PROTIME-INR  RAPID URINE DRUG SCREEN, HOSP PERFORMED  HEMOGLOBIN A1C  TROPONIN I  TROPONIN I  TROPONIN I  HIV ANTIBODY (ROUTINE TESTING W REFLEX)  CREATININE, SERUM  BASIC METABOLIC PANEL  CBC  I-STAT TROPONIN, ED    EKG EKG Interpretation  Date/Time:  Tuesday March 12 2018 17:51:16 EDT Ventricular Rate:  66 PR Interval:    QRS Duration: 88 QT Interval:  402 QTC Calculation: 422 R Axis:   83 Text Interpretation:  Sinus arrhythmia Probable LVH with secondary repol abnrm Lateral infarct, acute (LAD) Anterior ST elevation, probably due to LVH Baseline wander in lead(s) V6 >>> Acute MI <<< Confirmed by Benjiman Core [phone]) on 03/12/2018 5:55:20 PM   Radiology Dg Chest 2 View  Result Date: 03/12/2018 CLINICAL DATA:  Epigastric pain EXAM: CHEST - 2 VIEW COMPARISON:  PET CT 12/16/2012 FINDINGS: No focal opacity or pleural effusion. Normal heart size. Fullness in the hilar regions likely represent nodes, slightly less prominent as compared with prior radiograph. No pneumothorax IMPRESSION: No active cardiopulmonary disease. Bilateral hilar fullness, likely corresponds to lymphadenopathy. This appears slightly decreased as compared with 2014 radiograph. Electronically Signed   By: Jasmine Pang M.D.   On: 03/12/2018 17:51    Procedures Procedures (including critical care time)  Medications Ordered in ED Medications  0.9 %  sodium chloride infusion ( Intravenous New Bag/Given 03/12/18 1809)  aspirin chewable tablet 324 mg (has no administration in time range)  bismuth subsalicylate (PEPTO BISMOL) 262 MG/15ML suspension 30 mL (has no administration in time range)  pantoprazole (PROTONIX) EC tablet 40 mg (has no administration in time range)  amLODipine (NORVASC) tablet 10 mg (has no administration in time range)  nitroGLYCERIN (NITROSTAT) SL tablet 0.4 mg (has no administration in time range)  hydrALAZINE (APRESOLINE) injection  10 mg (has no administration in time range)  hydrALAZINE (APRESOLINE) injection 5 mg (has no administration in time range)  morphine 2 MG/ML injection 2 mg (has no administration in time range)  enoxaparin (LOVENOX) injection 40 mg (has no administration in time range)  acetaminophen (TYLENOL) tablet 650 mg (has no administration in time range)    Or  acetaminophen (TYLENOL) suppository 650 mg (has no administration in time range)  senna-docusate (Senokot-S) tablet 1 tablet (has no administration in time range)  ondansetron (ZOFRAN) tablet 4 mg (has no administration in time range)    Or  ondansetron (ZOFRAN) injection 4 mg (has no administration in time range)  zolpidem (AMBIEN) tablet 5 mg (has no administration in time range)  0.9 %  sodium chloride infusion ( Intravenous New Bag/Given 03/12/18 2348)  aspirin chewable tablet 324 mg (324 mg Oral Given 03/12/18 1808)  heparin injection 4,000 Units (4,000 Units Intravenous Given 03/12/18  1808)  gi cocktail (Maalox,Lidocaine,Donnatal) (30 mLs Oral Given 03/12/18 1918)     Initial Impression / Assessment and Plan / ED Course  I have reviewed the triage vital signs and the nursing notes.  Pertinent labs & imaging results that were available during my care of the patient were reviewed by me and considered in my medical decision making (see chart for details).    Patient with chest pain.  EKG showed LVH versus STEMI.  Has some elevation in aVL and V2.  Depression inferiorly and laterally.  Had change from prior EKG.  Will code STEMI called and seen in the ER by Dr. Katrinka Blazing.  Thinks the changes are more likely from LVH.  Initial troponin negative and code STEMI canceled.  Patient with chest pain.  Had hypertension also.  Creatinine mildly increased but unknown baseline.  Feels better after vomiting.  However with hypertension and chest pain and EKG changes will admit to hospitalist.  Final Clinical Impressions(s) / ED Diagnoses   Final diagnoses:    Chest pain, unspecified type  Hypertension, unspecified type    ED Discharge Orders    None       Benjiman Core, MD 03/13/18 0008

## 2018-03-12 NOTE — Progress Notes (Signed)
   03/12/18 2000  Clinical Encounter Type  Visited With Patient;Health care provider  Visit Type Initial;ED;Code;Other (Comment) (STEMI)  Referral From Other (Comment) (STEMI page)  Stress Factors  Patient Stress Factors Loss of control   Code STEMI, arrived at 6:16pm  Spoke w/ patient.  York Spaniel a friend knew he was present.  Pt asked me to call the following, in order, when each successive person did not answer:  (I called each number twice)  I also let pt know that no one answered any of the calls. Josef Tourigny:  [phone] Sister Brexton Sofia:  [phone] Zoila Shutter McCoy:  [phone]  Also prayed w/ pt at his request.  Provided compassionate presence and listening ear.  Called away to another emergent case, but let pt and RN know to page if pt desires chaplain to return.  Margretta Sidle resident, [phone]

## 2018-03-12 NOTE — H&P (Signed)
History and Physical    Eric Thornton ZOX:[id number] DOB: [birth year] DOA: 03/12/2018  Referring MD/NP/PA:   PCP: Patient, No Pcp Per   Patient coming from:  The patient is coming from home.  At baseline, pt is independent for most of ADL.   Chief Complaint: Chest pain  HPI: Eric Thornton is a 52 y.o. male with medical history significant of hypertension, cocaine abuse, CKD 2, mediastinal lymphadenopathy, who presents with chest pain  Patient states that his chest pain started at about 1 PM.  It is located in the lower chest, 10 out of 10 severity, radiating down to epigastric area.  Patient denies cough, shortness of breath.  No recent long distant traveling or tenderness the cough varies.  Patient has nausea and vomited several times with nonbloody, non-biliary vomitus.  Denies diarrhea.  No symptoms of UTI or unilateral weakness.  Patient admitted that he used crack 2 days ago.  EKG showed ST elevation in lead I/aVL.  Cardiology, Dr. Katrinka Blazing was consulted, who did not think patient has STEMI.   ED Course: pt was found to have negative troponin, WBC 3.7, INR 0.1, PTT 30, lipase 45, normal liver function, slightly worsening renal function, temperature normal, bradycardia, tachypnea, oxygen saturation 98% on room air.  Chest x-ray showed bilateral hilar fullness corresponding to lymphadenopathy, otherwise no infiltration.  Patient is placed on telemetry bed for observation.  Review of Systems:   General: no fevers, chills, no body weight gain, has fatigue HEENT: no blurry vision, hearing changes or sore throat Respiratory: no dyspnea, coughing, wheezing CV: has chest pain, no palpitations GI:  nausea, vomiting, epigastric abdominal pain, no diarrhea, constipation GU: no dysuria, burning on urination, increased urinary frequency, hematuria  Ext: no leg edema Neuro: no unilateral weakness, numbness, or tingling, no vision change or hearing loss Skin: no rash, no skin tear. MSK: No muscle  spasm, no deformity, no limitation of range of movement in spin Heme: No easy bruising.  Travel history: No recent long distant travel.  Allergy: No Known Allergies  Past Medical History:  Diagnosis Date  . Hypertension     History reviewed. No pertinent surgical history.  Social History:  reports that he has never smoked. He has never used smokeless tobacco. He reports that he drinks about 1.0 standard drinks of alcohol per week. He reports that he has current or past drug history. Drug: Cocaine.  Family History:  Family History  Problem Relation Age of Onset  . Thyroid disease Mother   . Heart disease Father   . Hypertension Father   . Cancer Sister      Prior to Admission medications   Medication Sig Start Date End Date Taking? Authorizing Provider  bismuth subsalicylate (PEPTO BISMOL) 262 MG/15ML suspension Take 30 mLs by mouth every 6 (six) hours as needed for indigestion.   Yes [provider]  naproxen (NAPROSYN) 500 MG tablet Take 1 tablet (500 mg total) by mouth 2 (two) times daily as needed for mild pain, moderate pain or headache (TAKE WITH MEALS.). Patient not taking: Reported on 03/12/2018 11/30/15   Street, [place], [place]    Physical Exam: Vitals:   03/12/18 2215 03/12/18 2230 03/12/18 2245 03/12/18 2353  BP: (!) 187/84 (!) 177/106 (!) 189/94 (!) 179/76  Pulse: 62 (!) 54 (!) 51 (!) 57  Resp: (!) 30 (!) 22 (!) 24 20  Temp:    97.8 F (36.6 C)  TempSrc:    Oral  SpO2: 98% 100% 99% 100%  Weight:      Height:       General: Not in acute distress HEENT:       Eyes: PERRL, EOMI, no scleral icterus.       ENT: No discharge from the ears and nose, no pharynx injection, no tonsillar enlargement.        Neck: No JVD, no bruit, no mass felt. Heme: No neck lymph node enlargement. Cardiac: S1/S2, RRR, No murmurs, No gallops or rubs. Respiratory: No rales, wheezing, rhonchi or rubs. GI: Soft, nondistended, mild tenderness in epigastric area, no rebound pain,  no organomegaly, BS present. GU: No hematuria Ext: No pitting leg edema bilaterally. 2+DP/PT pulse bilaterally. Musculoskeletal: No joint deformities, No joint redness or warmth, no limitation of ROM in spin. Skin: No rashes.  Neuro: Alert, oriented X3, cranial nerves II-XII grossly intact, moves all extremities normally.  Psych: Patient is not psychotic, no suicidal or hemocidal ideation.  Labs on Admission: I have personally reviewed following labs and imaging studies  CBC: Recent Labs  Lab 03/12/18 1717 03/12/18 1829  WBC 3.7*  --   HGB 13.3 13.3  HCT 42.0 39.0  MCV 88.2  --   PLT 267  --    Basic Metabolic Panel: Recent Labs  Lab 03/12/18 1717 03/12/18 1829  NA 139 140  K 4.4 3.5  CL 104 103  CO2 24  --   GLUCOSE 98 107*  BUN 10 10  CREATININE 1.37* 1.30*  CALCIUM 9.2  --    GFR: Estimated Creatinine Clearance: 58.6 mL/min (A) (by C-G formula based on SCr of 1.3 mg/dL (H)). Liver Function Tests: Recent Labs  Lab 03/12/18 1717  AST 30  ALT 28  ALKPHOS 62  BILITOT 0.6  PROT 7.9  ALBUMIN 3.8   Recent Labs  Lab 03/12/18 1717  LIPASE 45   No results for input(s): AMMONIA in the last 168 hours. Coagulation Profile: Recent Labs  Lab 03/12/18 1830  INR 0.91   Cardiac Enzymes: Recent Labs  Lab 03/12/18 1717 03/13/18 0020  TROPONINI <0.03 <0.03   BNP (last 3 results) No results for input(s): PROBNP in the last 8760 hours. HbA1C: No results for input(s): HGBA1C in the last 72 hours. CBG: No results for input(s): GLUCAP in the last 168 hours. Lipid Profile: Recent Labs    03/12/18 1717  CHOL 195  HDL 41  LDLCALC 131*  TRIG 113  CHOLHDL 4.8   Thyroid Function Tests: No results for input(s): TSH, T4TOTAL, FREET4, T3FREE, THYROIDAB in the last 72 hours. Anemia Panel: No results for input(s): VITAMINB12, FOLATE, FERRITIN, TIBC, IRON, RETICCTPCT in the last 72 hours. Urine analysis:    Component Value Date/Time   COLORURINE STRAW (A)  03/12/2018 1716   APPEARANCEUR CLEAR 03/12/2018 1716   LABSPEC 1.013 03/12/2018 1716   PHURINE 7.0 03/12/2018 1716   GLUCOSEU NEGATIVE 03/12/2018 1716   HGBUR NEGATIVE 03/12/2018 1716   BILIRUBINUR NEGATIVE 03/12/2018 1716   KETONESUR NEGATIVE 03/12/2018 1716   PROTEINUR NEGATIVE 03/12/2018 1716   NITRITE NEGATIVE 03/12/2018 1716   LEUKOCYTESUR NEGATIVE 03/12/2018 1716   Sepsis Labs: @LABRCNTIP (procalcitonin:4,lacticidven:4) )No results found for this or any previous visit (from the past 240 hour(s)).   Radiological Exams on Admission: Dg Chest 2 View  Result Date: 03/12/2018 CLINICAL DATA:  Epigastric pain EXAM: CHEST - 2 VIEW COMPARISON:  PET CT 12/16/2012 FINDINGS: No focal opacity or pleural effusion. Normal heart size. Fullness in the hilar regions likely represent nodes, slightly less prominent as compared with prior  radiograph. No pneumothorax IMPRESSION: No active cardiopulmonary disease. Bilateral hilar fullness, likely corresponds to lymphadenopathy. This appears slightly decreased as compared with 2014 radiograph. Electronically Signed   By: Jasmine Pang M.D.   On: 03/12/2018 17:51     EKG: Independently reviewed.  Sinus rhythm, LVH, QTC 422, ST elevation 1/aVL, T wave inversion in inferior leads and 5-V6.  Assessment/Plan Principal Problem:   Chest pain Active Problems:   Lymphadenopathy, mediastinal   CKD (chronic kidney disease), stage II   Hypertensive urgency   Cocaine abuse (HCC)   Epigastric abdominal pain  Chest pain: pt has atypical chest pain.  Etiology is not clear.  Differential diagnosis include demand ischemia secondary to hypertensive urgency and cocaine abuse .Patient does not have any SOB, no signs of DVT, low suspicion for PE.  EKG has ST elevation.  Dr. Katrinka Blazing of cardiology evaluate the patient, who did not think patient has STEMI.  - will place on Tele bed for obs - Highly appreciate Dr. Katrinka Blazing recommendations - cycle CE q6 x3 and repeat EKG in the  am  - prn Nitroglycerin, Morphine, and aspirin - start lipitor 40 mg daily - Risk factor stratification: will check FLP, UDS and A1C   History of hypertension and hypertensive urgency: blood pressure 174/94.  Patient is not taking blood pressure medications at home. -Start amlodipine 10 mg daily -IV hydralazine as needed  Hx of mediastinal lymphadenopathy: Patient was evaluated by oncologist, Dr. Arbutus Ped, previous work-up 2014 suggested the possibility/probability of a lymphoproliferative disorder such as lymphoma.  The patient has not followed up with oncologist. -Need to follow-up with oncologist as outpatient.  CKD (chronic kidney disease), stage II: Slightly worsening renal function.  Baseline creatinine 1.0-1.2.  His creatinine is 1.30, BUN 10 today. -Follow-up renal function by BMP  Cocaine abuse (HCC): -Did counseling about importance of quitting substance abuse - Check UDS  Epigastric abdominal pain, nausea vomiting: Etiology is not clear.  Lipase normal.  Differential diagnosis include viral gastritis, GERD and possible marijuana abuse. -Start Pepcid - Continue home Pepto-Bismol   DVT ppx:  SQ Lovenox Code Status: Full code Family Communication: None at bed side.     Disposition Plan:  Anticipate discharge back to previous home environment Consults called:  Dr. Katrinka Blazing Admission status: Obs / tele    Date of Service 03/13/2018    Lorretta Harp [facility] Pager [phone]  If 7PM-7AM, please contact night-coverage [email or web address] Password [email or web address] 03/13/2018, 1:59 AM

## 2018-03-12 NOTE — Consult Note (Addendum)
Cardiology Consultation:   Patient ID: Eric Thornton MRN: [id number]; DOB: Jul 20, 1965  Admit date: 03/12/2018 Date of Consult: 03/12/2018  Primary Care Provider: Patient, No Pcp Per Primary Cardiologist: No primary care provider on file. None Primary Electrophysiologist:  None None   Patient Profile:   Eric Thornton is a 52 y.o. male with a hx of mediastinal lymphadenopathy and occasional illicit drug use who is being seen today for the evaluation of atypical chest pain and STEMI activation at the request of Dr. Rubin Payor.  History of Present Illness:   Eric Thornton 52 year old gentleman with a prior history of similar discomfort in 2014, describes as sharp in the right parasternal area that started after he ate a salad for lunch.  He felt he was having indigestion.  He ate lunch around 1:00 p.m.  He has been here in the emergency room for greater than an hour.  No laboratory data is available.  An EKG done reveals left ventricular hypertrophy with strain pattern.  Emergency department activated STEMI based upon evaluation of EKG and history of chest pain.  The patient was seen and examined.  He is having discomfort that has been continuous for greater than 5 hours.  No prior history of heart disease.  Similar episode some years ago (2014).  Had cocaine within the past 2 days.  Denies alcohol intake.  He does not smoke.  He is not short of breath.  There is no radiation of the discomfort.  No pleuritic components.  He is not on therapy for hypertension.  Blood pressure is severely elevated currently.  In conjunction with LVH he likely has untreated severe hypertension.  Past Medical History:  Diagnosis Date  . Hypertension     History reviewed. No pertinent surgical history.   Home Medications:  Prior to Admission medications   Medication Sig Start Date End Date Taking? Authorizing Provider  naproxen (NAPROSYN) 500 MG tablet Take 1 tablet (500 mg total) by mouth 2 (two) times daily as  needed for mild pain, moderate pain or headache (TAKE WITH MEALS.). 11/30/15   [address], [place], [place]    Inpatient Medications: Scheduled Meds:  Continuous Infusions: . sodium chloride 10 mL/hr at 03/12/18 1809   PRN Meds:   Allergies:   No Known Allergies  Social History:   Social History   Socioeconomic History  . Marital status: Single    Spouse name: Not on file  . Number of children: Not on file  . Years of education: Not on file  . Highest education level: Not on file  Occupational History  . Not on file  Social Needs  . Financial resource strain: Not on file  . Food insecurity:    Worry: Not on file    Inability: Not on file  . Transportation needs:    Medical: Not on file    Non-medical: Not on file  Tobacco Use  . Smoking status: Never Smoker  . Smokeless tobacco: Never Used  Substance and Sexual Activity  . Alcohol use: Yes    Alcohol/week: 1.0 standard drinks    Types: 1 Cans of beer per week    Comment: last one month ago  . Drug use: Yes    Types: Cocaine  . Sexual activity: Not on file  Lifestyle  . Physical activity:    Days per week: Not on file    Minutes per session: Not on file  . Stress: Not on file  Relationships  . Social connections:    Talks  on phone: Not on file    Gets together: Not on file    Attends religious service: Not on file    Active member of club or organization: Not on file    Attends meetings of clubs or organizations: Not on file    Relationship status: Not on file  . Intimate partner violence:    Fear of current or ex partner: Not on file    Emotionally abused: Not on file    Physically abused: Not on file    Forced sexual activity: Not on file  Other Topics Concern  . Not on file  Social History Narrative  . Not on file    Family History:    Family History  Problem Relation Age of Onset  . Cancer Sister     On 3 years ROS:  Please see the history of present illness.  Constipation, occasional cocaine  use.  Blood pressure [place] All other ROS reviewed and negative.     Physical Exam/Data:   Vitals:   03/12/18 1747 03/12/18 1748 03/12/18 1749 03/12/18 1801  BP: (!) 197/117     Pulse:  (!) 59 (!) 52   Resp: 19 (!) 22 (!) 23   Temp:      TempSrc:      SpO2:  99% 100%   Weight:    68 kg  Height:    5\' 6"  (1.676 m)   No intake or output data in the 24 hours ending 03/12/18 1823 Filed Weights   03/12/18 1801  Weight: 68 kg   Body mass index is 24.21 kg/m.  General:  Well nourished, well developed, in no acute distress.  Skin warm and dry. HEENT: normal Lymph: no adenopathy Neck: no JVD Endocrine:  No thryomegaly Vascular: No carotid bruits; FA pulses 2+ bilaterally without bruits  Cardiac:  normal S1, S2; RRR; no murmur or gallop.  No chest wall tenderness. Lungs:  clear to auscultation bilaterally, no wheezing, rhonchi or rales  Abd: soft, nontender, no hepatomegaly  Ext: no edema Musculoskeletal:  No deformities, BUE and BLE strength normal and equal Skin: warm and dry  Neuro:  CNs 2-12 intact, no focal abnormalities noted Psych:  Normal affect   EKG:  The EKG was personally reviewed and demonstrates: Sinus bradycardia, left ventricular hypertrophy with strain, slight rightward axis.  No definitive STEMI pattern is noted. Telemetry:  Telemetry was personally reviewed and demonstrates: Sinus bradycardia  Relevant CV Studies: No cardiac data has yet returned.  Laboratory Data:  ChemistryNo results for input(s): NA, K, CL, CO2, GLUCOSE, BUN, CREATININE, CALCIUM, GFRNONAA, GFRAA, ANIONGAP in the last 168 hours.  No results for input(s): PROT, ALBUMIN, AST, ALT, ALKPHOS, BILITOT in the last 168 hours. HematologyNo results for input(s): WBC, RBC, HGB, HCT, MCV, MCH, MCHC, RDW, PLT in the last 168 hours. Cardiac EnzymesNo results for input(s): TROPONINI in the last 168 hours. No results for input(s): TROPIPOC in the last 168 hours.  BNPNo results for input(s): BNP, PROBNP  in the last 168 hours.  DDimer No results for input(s): DDIMER in the last 168 hours.  Radiology/Studies:  Dg Chest 2 View  Result Date: 03/12/2018 CLINICAL DATA:  Epigastric pain EXAM: CHEST - 2 VIEW COMPARISON:  PET CT 12/16/2012 FINDINGS: No focal opacity or pleural effusion. Normal heart size. Fullness in the hilar regions likely represent nodes, slightly less prominent as compared with prior radiograph. No pneumothorax IMPRESSION: No active cardiopulmonary disease. Bilateral hilar fullness, likely corresponds to lymphadenopathy. This appears slightly decreased  as compared with 2014 radiograph. Electronically Signed   By: Jasmine Pang M.D.   On: 03/12/2018 17:51    Assessment and Plan:   1. Chest discomfort with atypical features: If cardiac markers are significantly elevated, and they should be given that he has had ongoing pain for greater than 5 hours, coronary angiography may be necessary to exclude obstructive disease.  The current presentation could be compatible with accelerated hypertension in the setting of left ventricular hypertrophy.  Cycle cardiac markers.  If troponin I is negative, consider acute aortic syndrome, pericarditis, pulmonary embolism/infarction (seems unlikely), or other explanations for chest pain such as choledocholithiasis. 2. Severe hypertension - possibly hypertensive emergency/urgency: EKG with pattern suggesting LVH with strain.  Given question of ongoing ischemia would recommend IV nitroglycerin to control blood pressure until additional laboratory data has returned. 3. Hilar fullness bilaterally either related to adenopathy or pulmonary artery enlargement: Prior work-up of hilar adenopathy in 2014 suggested the possibility/probability of a lymphoproliferative disorder such as lymphoma.  The patient never followed through on biopsy or tissue diagnosis.  Overall the patient appears to be uncomfortable and further work-up could include CT scan of the aorta to  exclude dissection/aortic ulceration, pericardial effusion, or other causes of lower sternal/epigastric pain.  Given likely diagnosis of lymphoma, invasive or compressive disease could be causing symptoms.  Blood pressure needs to be better controlled and would suggest starting with IV nitroglycerin.  Beta-blocker therapy may not be a good idea given resting bradycardia.  Will need to contact general cardiology for further thoughts.  For the time being we will cancel STEMI activation  Critical care time 40 minutes  For questions or updates, please contact [facility] spines blood pressure one 200/120 Please consult [email or web address] for contact info under     Signed, Lesleigh Noe, MD  03/12/2018 6:23 PM

## 2018-03-12 NOTE — ED Notes (Signed)
Attempted report 

## 2018-03-12 NOTE — ED Triage Notes (Signed)
Pt c/o mid epigastric pain that started over an hour ago, denies nausea/vomiting, states that he had the same pain a few years ago after being constipated. Pt states last BM yesterday. Reports taking peptobismol with no relief. Pt actively burping in triage.

## 2018-03-12 NOTE — ED Notes (Signed)
Code Stemi activated @ [id number]

## 2018-03-13 ENCOUNTER — Observation Stay (HOSPITAL_COMMUNITY): Payer: Self-pay

## 2018-03-13 ENCOUNTER — Ambulatory Visit (HOSPITAL_BASED_OUTPATIENT_CLINIC_OR_DEPARTMENT_OTHER): Payer: Self-pay

## 2018-03-13 ENCOUNTER — Encounter (HOSPITAL_COMMUNITY): Payer: Self-pay | Admitting: Internal Medicine

## 2018-03-13 DIAGNOSIS — I200 Unstable angina: Secondary | ICD-10-CM

## 2018-03-13 DIAGNOSIS — R1013 Epigastric pain: Secondary | ICD-10-CM | POA: Diagnosis present

## 2018-03-13 DIAGNOSIS — R590 Localized enlarged lymph nodes: Secondary | ICD-10-CM | POA: Diagnosis present

## 2018-03-13 DIAGNOSIS — K8020 Calculus of gallbladder without cholecystitis without obstruction: Secondary | ICD-10-CM | POA: Diagnosis present

## 2018-03-13 DIAGNOSIS — F1490 Cocaine use, unspecified, uncomplicated: Secondary | ICD-10-CM | POA: Diagnosis present

## 2018-03-13 LAB — RAPID URINE DRUG SCREEN, HOSP PERFORMED
AMPHETAMINES: NOT DETECTED
BENZODIAZEPINES: NOT DETECTED
Barbiturates: NOT DETECTED
Cocaine: POSITIVE — AB
OPIATES: NOT DETECTED
TETRAHYDROCANNABINOL: NOT DETECTED

## 2018-03-13 LAB — BASIC METABOLIC PANEL
ANION GAP: 10 (ref 5–15)
BUN: 9 mg/dL (ref 6–20)
CO2: 21 mmol/L — ABNORMAL LOW (ref 22–32)
Calcium: 8.6 mg/dL — ABNORMAL LOW (ref 8.9–10.3)
Chloride: 101 mmol/L (ref 98–111)
Creatinine, Ser: 1.02 mg/dL (ref 0.61–1.24)
GFR calc Af Amer: >60 mL/min (ref >60)
Glucose, Bld: 113 mg/dL — ABNORMAL HIGH (ref 70–99)
POTASSIUM: 3.6 mmol/L (ref 3.5–5.1)
SODIUM: 132 mmol/L — AB (ref 135–145)

## 2018-03-13 LAB — CBC
HEMATOCRIT: 41.8 % (ref 39.0–52.0)
HEMOGLOBIN: 13.6 g/dL (ref 13.0–17.0)
MCH: 27.6 pg (ref 26.0–34.0)
MCHC: 32.5 g/dL (ref 30.0–36.0)
MCV: 84.8 fL (ref 78.0–100.0)
Platelets: 260 10*3/uL (ref 150–400)
RBC: 4.93 MIL/uL (ref 4.22–5.81)
RDW: 11.9 % (ref 11.5–15.5)
WBC: 6.6 10*3/uL (ref 4.0–10.5)

## 2018-03-13 LAB — ECHOCARDIOGRAM COMPLETE
HEIGHTINCHES: 66 in
Weight: 2400 oz

## 2018-03-13 LAB — TROPONIN I
Troponin I: <0.03 ng/mL (ref <0.03)
Troponin I: <0.03 ng/mL (ref <0.03)

## 2018-03-13 LAB — HIV ANTIBODY (ROUTINE TESTING W REFLEX): HIV SCREEN 4TH GENERATION: NONREACTIVE

## 2018-03-13 MED ORDER — SODIUM CHLORIDE 0.9 % IV SOLN
2.0000 g | INTRAVENOUS | Status: DC
  Administered 2018-03-13 – 2018-03-14 (×2): 2 g via INTRAVENOUS
  Filled 2018-03-13 (×3): qty 20

## 2018-03-13 MED ORDER — ATORVASTATIN CALCIUM 40 MG PO TABS
40.0000 mg | ORAL_TABLET | Freq: Every day | ORAL | Status: DC
  Administered 2018-03-13 – 2018-03-14 (×3): 40 mg via ORAL
  Filled 2018-03-13 (×3): qty 1

## 2018-03-13 MED ORDER — LOSARTAN POTASSIUM 25 MG PO TABS
25.0000 mg | ORAL_TABLET | Freq: Every day | ORAL | Status: DC
  Administered 2018-03-13 – 2018-03-15 (×3): 25 mg via ORAL
  Filled 2018-03-13 (×3): qty 1

## 2018-03-13 MED ORDER — POTASSIUM CHLORIDE CRYS ER 20 MEQ PO TBCR
40.0000 meq | EXTENDED_RELEASE_TABLET | Freq: Once | ORAL | Status: AC
  Administered 2018-03-13: 40 meq via ORAL
  Filled 2018-03-13: qty 2

## 2018-03-13 MED ORDER — FAMOTIDINE 20 MG PO TABS
20.0000 mg | ORAL_TABLET | Freq: Two times a day (BID) | ORAL | Status: DC
  Administered 2018-03-13 – 2018-03-15 (×5): 20 mg via ORAL
  Filled 2018-03-13 (×5): qty 1

## 2018-03-13 MED ORDER — SODIUM CHLORIDE 0.9 % IV SOLN: 1.0000 g | INTRAVENOUS | Status: DC

## 2018-03-13 MED ORDER — MORPHINE SULFATE (PF) 4 MG/ML IV SOLN
2.8000 mg | Freq: Once | INTRAVENOUS | Status: AC
  Administered 2018-03-13: 2.8 mg via INTRAVENOUS

## 2018-03-13 MED ORDER — MORPHINE SULFATE (PF) 4 MG/ML IV SOLN
INTRAVENOUS | Status: AC
  Filled 2018-03-13: qty 1

## 2018-03-13 MED ORDER — TECHNETIUM TC 99M MEBROFENIN IV KIT
5.0000 | PACK | Freq: Once | INTRAVENOUS | Status: AC | PRN
  Administered 2018-03-13: 5 via INTRAVENOUS

## 2018-03-13 MED ORDER — IOPAMIDOL (ISOVUE-370) INJECTION 76%
INTRAVENOUS | Status: AC
  Administered 2018-03-13: 100 mL
  Filled 2018-03-13: qty 100

## 2018-03-13 NOTE — Progress Notes (Signed)
Progress Note  Patient Name: Eric Thornton Date of Encounter: 03/13/2018  Primary Cardiologist: New to [facility]   Subjective   Patient still reporting some lower chest/epigastric pain. No recurrence of nausea/vomiting. He reports family history of CAD in his father who died from an MI in his 32s and mother who died from an MI in her 54s.   Inpatient Medications    Scheduled Meds: . amLODipine  10 mg Oral Daily  . aspirin  324 mg Oral Daily  . atorvastatin  40 mg Oral q1800  . enoxaparin (LOVENOX) injection  40 mg Subcutaneous Q24H  . famotidine  20 mg Oral BID   Continuous Infusions: . sodium chloride Stopped (03/12/18 1831)   PRN Meds: acetaminophen **OR** acetaminophen, bismuth subsalicylate, hydrALAZINE, nitroGLYCERIN, ondansetron **OR** ondansetron (ZOFRAN) IV, senna-docusate, zolpidem   Vital Signs    Vitals:   03/12/18 2230 03/12/18 2245 03/12/18 2353 03/13/18 0556  BP: (!) 177/106 (!) 189/94 (!) 179/76 (!) 158/92  Pulse: (!) 54 (!) 51 (!) 57 98  Resp: (!) 22 (!) 24 20 19   Temp:   97.8 F (36.6 C) 99 F (37.2 C)  TempSrc:   Oral Oral  SpO2: 100% 99% 100% 96%  Weight:      Height:        Intake/Output Summary (Last 24 hours) at 03/13/2018 1027 Last data filed at 03/13/2018 0746 Gross per 24 hour  Intake 790.45 ml  Output 150 ml  Net 640.45 ml   Filed Weights   03/12/18 1801  Weight: 68 kg    Telemetry    Sinus rhythm with sinus arrhythmia and intermittent sinus tachycardia - Personally Reviewed  ECG    Sinus rhythm with sinus arrhythmia, LVH with strain pattern, TWI in V4-6. - Personally Reviewed  Physical Exam   GEN: Laying in bed in no acute distress.   Neck: No JVD, no carotid bruits Cardiac: RRR, no murmurs, rubs, or gallops.  Respiratory: Clear to auscultation bilaterally, no wheezes/ rales/ rhonchi GI: NABS, Soft, nontender, non-distended  MS: No edema; No deformity. Neuro:  Nonfocal, moving all extremities  spontaneously Psych: Normal affect   Labs    Chemistry Recent Labs  Lab 03/12/18 1717 03/12/18 1829 03/13/18 0608  NA 139 140 132*  K 4.4 3.5 3.6  CL 104 103 101  CO2 24  --  21*  GLUCOSE 98 107* 113*  BUN 10 10 9   CREATININE 1.37* 1.30* 1.02  CALCIUM 9.2  --  8.6*  PROT 7.9  --   --   ALBUMIN 3.8  --   --   AST 30  --   --   ALT 28  --   --   ALKPHOS 62  --   --   BILITOT 0.6  --   --   GFRNONAA 57*  --  >60  GFRAA >60  --  >60  ANIONGAP 11  --  10     Hematology Recent Labs  Lab 03/12/18 1717 03/12/18 1829 03/13/18 0608  WBC 3.7*  --  6.6  RBC 4.76  --  4.93  HGB 13.3 13.3 13.6  HCT 42.0 39.0 41.8  MCV 88.2  --  84.8  MCH 27.9  --  27.6  MCHC 31.7  --  32.5  RDW 12.1  --  11.9  PLT 267  --  260    Cardiac Enzymes Recent Labs  Lab 03/12/18 1717 03/13/18 0020 03/13/18 0608  TROPONINI <0.03 <0.03 <0.03    Recent  Labs  Lab 03/12/18 1827  TROPIPOC 0.01     BNPNo results for input(s): BNP, PROBNP in the last 168 hours.   DDimer No results for input(s): DDIMER in the last 168 hours.   Radiology    Dg Chest 2 View  Result Date: 03/12/2018 CLINICAL DATA:  Epigastric pain EXAM: CHEST - 2 VIEW COMPARISON:  PET CT 12/16/2012 FINDINGS: No focal opacity or pleural effusion. Normal heart size. Fullness in the hilar regions likely represent nodes, slightly less prominent as compared with prior radiograph. No pneumothorax IMPRESSION: No active cardiopulmonary disease. Bilateral hilar fullness, likely corresponds to lymphadenopathy. This appears slightly decreased as compared with 2014 radiograph. Electronically Signed   By: Jasmine Pang M.D.   On: 03/12/2018 17:51    Cardiac Studies   None  Patient Profile     52 y.o. male with a PMH of HTN, cocaine abuse, mediastinal lymphadenopathy (lost to follow-up with Dr. Arbutus Ped), who presented with chest pain for which cardiology is following  Assessment & Plan    1. Atypical chest pain: likely occurred in  the setting of hypertensive urgency, recent cocaine use, and N/V. Code STEMI initially activated but EKG reviewed by Dr. Katrinka Blazing and felt to be LVH with strain pattern. Troponins remained negative.  - Will check a CTA chest to r/o aortic dissection/ PE (unlikely) - Will check an echocardiogram to assess LV function and wall motion  2. Hypertensive urgency/emergency: BP elevated to 197/117, improved this morning to 158/92 after starting amlodipine 10mg  daily with prn hydralazine.  - Continue amlodipine 10mg  daily - Will start losartan 25mg  daily   3. HLD: LDL 131 this admission. Started on 40mg  atorvastatin  - Continue atorvastatin for risk modification  4. Epigastric pain, nausea, vomiting: Continues to have mild epigastric and RUQ pain.  - Will defer management to primary team - could consider RUQ [place] to evaluate for gallbladder pathology  5. Cocaine abuse: patient reports using cocaine 2 days ago. Discussed cardiac complications associated with cocaine use and counseled on importance of cessation.  - Continue to encourage abstinence   6. Mediastinal lymphadenopathy: previously followed with Dr. Arbutus Ped but was lost to follow-up prior to biopsy. Findings c/f possible lymphoma.  - Will defer management to primary team/ outpatient follow-up with Oncology  For questions or updates, please contact [facility] Please consult [email or web address] for contact info under Cardiology/STEMI.      Signed, Beatriz Stallion, PA-C  03/13/2018, 8:22 AM   [phone]

## 2018-03-13 NOTE — Progress Notes (Signed)
  Echocardiogram 2D Echocardiogram has been performed.  Eric Thornton 03/13/2018, 12:04 PM

## 2018-03-13 NOTE — Progress Notes (Signed)
[facility] PROGRESS NOTE  KYHEEM BATHGATE ONG:[id number] DOB: 04/01/1966 DOA: 03/12/2018 PCP: Patient, No Pcp Per  Assessment/Plan: Chest pain/epigastric abdominal pain with nausea and vomiting: Atypical. May be related to hypertensive urgency and cocaine abuse.  EKG had ST elevation. Dr. Katrinka Blazing of cardiology evaluated the patient, who did not think patient has STEMI. Pain located RUQ this am. No events on tele. Troponin negative. CTA negative for PE/dissection, echo with ef 55% grade 1 DD. UDS + cocaine. Lipase within limits of normal.  RUQ US reveals gallstones with thickened, edematous gallbladder wall. These are findings felt to be indicative of acute cholecystitis - prn Nitroglycerin, Morphine, and aspirin - start lipitor 40 mg daily and aspirin -improved BP control -Hida scan -full liquid diet -gen surg consult-   History of hypertension and hypertensive urgency: hx of non-compliance. BP high end of normal this am. Patient is not taking blood pressure medications at home. Amlodipine 10 mg daily started and cardiology added losarta -continue amlodipine and losartan -monitor BP closely -IV hydralazine as needed  Hx of mediastinal lymphadenopathy: Patient was evaluated by oncologist, Dr. Arbutus Ped, previous work-up 2014 suggested the possibility/probability of a lymphoproliferative disorder such as lymphoma. The patient has not followed up with oncologist. -Need to follow-up with oncologist as outpatient.  CKD (chronic kidney disease), stage II: resolved this am.  -Follow-up renal function by BMP  Cocaine abuse (HCC): -Did counseling about importance of quitting substance abuse - UDS + cocaine  Epigastric abdominal pain, nausea vomiting: Etiology is not clear.  Lipase normal.  Differential diagnosis include viral gastritis, GERD and possible marijuana abuse. -Start Pepcid - Continue home Pepto-Bismol   Code Status: full Family Communication: none present Disposition  Plan: home hopefully in am   Consultants:  cardiology  Procedures:  Echo  HIDA  Antibiotics:  none  HPI/Subjective: Patient hx htn, cocaine use, CKD 2, mdiastinal lymphadenopathy admitted 10/1 with cc chest pain. Reported  that his chest pain started at about 1 PM on day of admission.  It is located in the lower chest, 10 out of 10 severity, radiating down to epigastric area.  Patient denied cough, shortness of breath.  No recent long distant traveling or tenderness the cough varies.  Patient had nausea and vomited several times with nonbloody, non-biliary vomitus.  Denied diarrhea.  No symptoms of UTI or unilateral weakness.  Patient admitted that he used crack 2 days ago.  EKG showed ST elevation in lead I/aVL.  Cardiology, Dr. Katrinka Blazing was consulted, who did not think patient has STEMI.   Objective: Vitals:   03/12/18 2353 03/13/18 0556  BP: (!) 179/76 (!) 158/92  Pulse: (!) 57 98  Resp: 20 19  Temp: 97.8 F (36.6 C) 99 F (37.2 C)  SpO2: 100% 96%    Intake/Output Summary (Last 24 hours) at 03/13/2018 1523 Last data filed at 03/13/2018 1244 Gross per 24 hour  Intake 790.45 ml  Output 150 ml  Net 640.45 ml   Filed Weights   03/12/18 1801  Weight: 68 kg    Exam:   General:  Lying in bed alert and oriented in no acute distress  Cardiovascular: rrr no MGR no LE edema  Respiratory: normal effort BS clear bilaterally no wheeze  Abdomen: soft non-distended no guarding or rebounding.   Musculoskeletal: joints without swelling/erythema    Data Reviewed: Basic Metabolic Panel: Recent Labs  Lab 03/12/18 1717 03/12/18 1829 03/13/18 0608  NA 139 140 132*  K 4.4 3.5 3.6  CL 104 103 101  CO2 24  --  21*  GLUCOSE 98 107* 113*  BUN 10 10 9   CREATININE 1.37* 1.30* 1.02  CALCIUM 9.2  --  8.6*   Liver Function Tests: Recent Labs  Lab 03/12/18 1717  AST 30  ALT 28  ALKPHOS 62  BILITOT 0.6  PROT 7.9  ALBUMIN 3.8   Recent Labs  Lab 03/12/18 1717  LIPASE  45   No results for input(s): AMMONIA in the last 168 hours. CBC: Recent Labs  Lab 03/12/18 1717 03/12/18 1829 03/13/18 0608  WBC 3.7*  --  6.6  HGB 13.3 13.3 13.6  HCT 42.0 39.0 41.8  MCV 88.2  --  84.8  PLT 267  --  260   Cardiac Enzymes: Recent Labs  Lab 03/12/18 1717 03/13/18 0020 03/13/18 0608 03/13/18 1253  TROPONINI <0.03 <0.03 <0.03 <0.03   BNP (last 3 results) No results for input(s): BNP in the last 8760 hours.  ProBNP (last 3 results) No results for input(s): PROBNP in the last 8760 hours.  CBG: No results for input(s): GLUCAP in the last 168 hours.  No results found for this or any previous visit (from the past 240 hour(s)).   Studies: Dg Chest 2 View  Result Date: 03/12/2018 CLINICAL DATA:  Epigastric pain EXAM: CHEST - 2 VIEW COMPARISON:  PET CT 12/16/2012 FINDINGS: No focal opacity or pleural effusion. Normal heart size. Fullness in the hilar regions likely represent nodes, slightly less prominent as compared with prior radiograph. No pneumothorax IMPRESSION: No active cardiopulmonary disease. Bilateral hilar fullness, likely corresponds to lymphadenopathy. This appears slightly decreased as compared with 2014 radiograph. Electronically Signed   By: Jasmine Pang M.D.   On: 03/12/2018 17:51   Ct Angio Chest Aorta W/cm &/or Wo/cm  Result Date: 03/13/2018 CLINICAL DATA:  Nausea and midline chest pain today. History of mediastinal and hilar lymphadenopathy. EXAM: CT ANGIOGRAPHY CHEST WITH CONTRAST TECHNIQUE: Multidetector CT imaging of the chest was performed using the standard protocol during bolus administration of intravenous contrast. Multiplanar CT image reconstructions and MIPs were obtained to evaluate the vascular anatomy. CONTRAST:  ISOVUE-370 IOPAMIDOL (ISOVUE-370) INJECTION 76% COMPARISON:  Chest CT 11/22/2012 and PET-CT 12/16/2012 FINDINGS: Cardiovascular: The heart is normal in size. No pericardial effusion. The aorta is normal in caliber. No  atherosclerotic calcifications. No dissection. The branch vessels are patent. Scattered coronary artery calcifications are noted. The pulmonary arteries appear normal. No findings to suggest pulmonary embolism. Mediastinum/Nodes: Stable appearing mediastinal and hilar lymphadenopathy since 2014. 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 The esophagus is grossly normal. Lungs/Pleura: No acute pulmonary findings. No worrisome pulmonary lesions. Minimal dependent subpleural atelectasis. No findings to suggest interstitial lung disease or bronchiectasis. Upper Abdomen: No significant upper abdominal findings. The upper abdominal aorta is unremarkable and visualized branch vessels appear normal. Chest wall/musculoskeletal: No chest wall mass, supraclavicular or axillary lymphadenopathy. Minimal stable gynecomastia. The thyroid gland appears normal. The bony thorax is intact.  No bone lesions or fracture. Review of the MIP images confirms the above findings. IMPRESSION: 1. Normal CT appearance of the thoracic aorta. No aneurysm or dissection. 2. The pulmonary arteries appear normal. No findings for pulmonary embolism. 3. No acute pulmonary findings. 4. Stable symmetric bulky mediastinal and hilar lymphadenopathy since 2014, most likely due to sarcoidosis. Electronically Signed   By: Rudie Meyer M.D.   On: 03/13/2018 10:01   US Abdomen Limited Ruq  Result Date: 03/13/2018 CLINICAL DATA:  Right upper quadrant pain EXAM: ULTRASOUND ABDOMEN LIMITED RIGHT UPPER QUADRANT  COMPARISON:  PET-CT December 16, 2012 FINDINGS: Gallbladder: Within the gallbladder, there are multiple echogenic foci which move and shadow consistent with cholelithiasis. Largest gallstone measures 1.9 cm in length. Gallbladder wall is thickened and edematous. There is no appreciable pericholecystic fluid. No sonographic Murphy sign noted by sonographer. Common bile duct: Diameter: 3 mm. No intrahepatic or extrahepatic biliary duct dilatation. Liver: No  focal lesion identified. Within normal limits in parenchymal echogenicity. Portal vein is patent on color Doppler imaging with normal direction of blood flow towards the liver. IMPRESSION: Gallstones with thickened, edematous gallbladder wall. These are findings felt to be indicative of acute cholecystitis. Study otherwise unremarkable. These results will be called to the ordering clinician or representative by the Radiologist Assistant, and communication documented in the PACS or zVision Dashboard. Electronically Signed   By: Bretta Bang III M.D.   On: 03/13/2018 12:31    Scheduled Meds: . amLODipine  10 mg Oral Daily  . aspirin  324 mg Oral Daily  . atorvastatin  40 mg Oral q1800  . enoxaparin (LOVENOX) injection  40 mg Subcutaneous Q24H  . famotidine  20 mg Oral BID  . losartan  25 mg Oral Daily   Continuous Infusions: . sodium chloride Stopped (03/12/18 1831)    Principal Problem:   Chest pain Active Problems:   Hypertensive urgency   Epigastric abdominal pain   Lymphadenopathy, mediastinal   CKD (chronic kidney disease), stage II   Cocaine abuse (HCC)   Cholelithiasis   Cocaine use   Mediastinal lymphadenopathy    Time spent: 45 minutes    [facility]  [facility]  If 7PM-7AM, please contact night-coverage at [email or web address], password [facility] 03/13/2018, 3:23 PM  LOS: 0 days

## 2018-03-13 NOTE — Plan of Care (Signed)
  Problem: Education: Goal: Knowledge of General Education information will improve Description Including pain rating scale, medication(s)/side effects and non-pharmacologic comfort measures Outcome: Progressing Note:  POC and orders reviewed with pt.   

## 2018-03-13 NOTE — Consult Note (Signed)
Reason for Consult: epigastric pain Referring Physician: Guilherme, Schwenke is an 52 y.o. male.  HPI: 52 yo male with 3 day history of lower chest pain. Pain was constant. It was made worse by food. He had pain like this 1 time before 2 years ago and it went away on its own. The pain has now resolved after he took a liquid medication that made him vomit. He denies nausea or vomiting at this time. He did not have diarrhea.  Past Medical History:  Diagnosis Date  . Cocaine use   . Dyslipidemia   . ETOH abuse   . Hypertension   . Hypertension   . Mediastinal lymphadenopathy     History reviewed. No pertinent surgical history.  Family History  Problem Relation Age of Onset  . Thyroid disease Mother   . Heart disease Father   . Hypertension Father   . Cancer Sister     Social History:  reports that he has never smoked. He has never used smokeless tobacco. He reports that he drinks about 1.0 standard drinks of alcohol per week. He reports that he has current or past drug history. Drug: Cocaine.  Allergies: No Known Allergies  Medications: I have reviewed the patient's current medications.  Results for orders placed or performed during the hospital encounter of 03/12/18 (from the past 48 hour(s))  Urinalysis, Routine w reflex microscopic     Status: Abnormal   Collection Time: 03/12/18  5:16 PM  Result Value Ref Range   Color, Urine STRAW (A) YELLOW   APPearance CLEAR CLEAR   Specific Gravity, Urine 1.013 1.005 - 1.030   pH 7.0 5.0 - 8.0   Glucose, UA NEGATIVE NEGATIVE mg/dL   Hgb urine dipstick NEGATIVE NEGATIVE   Bilirubin Urine NEGATIVE NEGATIVE   Ketones, ur NEGATIVE NEGATIVE mg/dL   Protein, ur NEGATIVE NEGATIVE mg/dL   Nitrite NEGATIVE NEGATIVE   Leukocytes, UA NEGATIVE NEGATIVE    Comment: Performed at [facility] [address] [place], [place] [address]  Lipase, blood     Status: None   Collection Time: 03/12/18  5:17 PM  Result Value Ref  Range   Lipase 45 11 - 51 U/L    Comment: Performed at [facility] [address] [place], [place] [address]  Comprehensive metabolic panel     Status: Abnormal   Collection Time: 03/12/18  5:17 PM  Result Value Ref Range   Sodium 139 135 - 145 mmol/L   Potassium 4.4 3.5 - 5.1 mmol/L   Chloride 104 98 - 111 mmol/L   CO2 24 22 - 32 mmol/L   Glucose, Bld 98 70 - 99 mg/dL   BUN 10 6 - 20 mg/dL   Creatinine, Ser 1.37 (H) 0.61 - 1.24 mg/dL   Calcium 9.2 8.9 - 10.3 mg/dL   Total Protein 7.9 6.5 - 8.1 g/dL   Albumin 3.8 3.5 - 5.0 g/dL   AST 30 15 - 41 U/L   ALT 28 0 - 44 U/L   Alkaline Phosphatase 62 38 - 126 U/L   Total Bilirubin 0.6 0.3 - 1.2 mg/dL   GFR calc non Af Amer 57 (L) >60 mL/min   GFR calc Af Amer >60 >60 mL/min    Comment: (NOTE) The eGFR has been calculated using the CKD EPI equation. This calculation has not been validated in all clinical situations. eGFR's persistently <60 mL/min signify possible Chronic Kidney Disease.    Anion gap 11  5 - 15    Comment: Performed at [facility], [place] [address], [place], [place] [address]  CBC     Status: Abnormal   Collection Time: 03/12/18  5:17 PM  Result Value Ref Range   WBC 3.7 (L) 4.0 - 10.5 K/uL   RBC 4.76 4.22 - 5.81 MIL/uL   Hemoglobin 13.3 13.0 - 17.0 g/dL   HCT 42.0 39.0 - 52.0 %   MCV 88.2 78.0 - 100.0 fL   MCH 27.9 26.0 - 34.0 pg   MCHC 31.7 30.0 - 36.0 g/dL   RDW 12.1 11.5 - 15.5 %   Platelets 267 150 - 400 K/uL    Comment: Performed at [facility] [address], [place], [place] [address]  Troponin I     Status: None   Collection Time: 03/12/18  5:17 PM  Result Value Ref Range   Troponin I <0.03 <0.03 ng/mL    Comment: Performed at [facility] [address], [place], [place] [address]  Lipid panel     Status: Abnormal   Collection Time: 03/12/18  5:17 PM  Result Value Ref Range   Cholesterol 195 0 - 200 mg/dL   Triglycerides 113 <150 mg/dL   HDL 41 >40 mg/dL   Total  CHOL/HDL Ratio 4.8 RATIO   VLDL 23 0 - 40 mg/dL   LDL Cholesterol 131 (H) 0 - 99 mg/dL    Comment:        Total Cholesterol/HDL:CHD Risk Coronary Heart Disease Risk Table                     Men   Women  1/2 Average Risk   3.4   3.3  Average Risk       5.0   4.4  2 X Average Risk   9.6   7.1  3 X Average Risk  23.4   11.0        Use the calculated Patient Ratio above and the CHD Risk Table to determine the patient's CHD Risk.        ATP III CLASSIFICATION (LDL):  <100     mg/dL   Optimal  100-129  mg/dL   Near or Above                    Optimal  130-159  mg/dL   Borderline  160-189  mg/dL   High  >190     mg/dL   Very High Performed at [facility] [address], [place], [place] [address]   I-stat troponin, ED     Status: None   Collection Time: 03/12/18  6:27 PM  Result Value Ref Range   Troponin i, poc 0.01 0.00 - 0.08 ng/mL   Comment 3            Comment: Due to the release kinetics of cTnI, a negative result within the first hours of the onset of symptoms does not rule out myocardial infarction with certainty. If myocardial infarction is still suspected, repeat the test at appropriate intervals.   APTT     Status: None   Collection Time: 03/12/18  6:29 PM  Result Value Ref Range   aPTT 30 24 - 36 seconds    Comment: Performed at [facility], [place] [address], [place], [place] [address]  I-stat Chem 8, ED     Status: Abnormal   Collection Time: 03/12/18  6:29 PM  Result Value Ref Range   Sodium 140 135 - 145 mmol/L   Potassium 3.5 3.5 - 5.1 mmol/L   Chloride 103 98 - 111 mmol/L   BUN 10 6 - 20 mg/dL   Creatinine, Ser 1.30 (H) 0.61 - 1.24 mg/dL   Glucose, Bld 107 (H) 70 - 99 mg/dL   Calcium, Ion 1.02 (L) 1.15 - 1.40 mmol/L   TCO2 25 22 - 32 mmol/L   Hemoglobin 13.3 13.0 - 17.0 g/dL   HCT 39.0 39.0 - 52.0 %  Protime-INR     Status: None   Collection Time: 03/12/18  6:30 PM  Result Value Ref Range   Prothrombin Time 12.2 11.4 - 15.2 seconds    INR 0.91     Comment: Performed at [facility], [place] [address], [place], [place] [address]  Troponin I (q 6hr x 3)     Status: None   Collection Time: 03/13/18 12:20 AM  Result Value Ref Range   Troponin I <0.03 <0.03 ng/mL    Comment: Performed at [facility] [address] [place] [address], [place], [place] [address]  HIV antibody (Routine Testing)     Status: None   Collection Time: 03/13/18 12:20 AM  Result Value Ref Range   HIV Screen 4th Generation wRfx Non Reactive Non Reactive    Comment: (NOTE) Performed At: [facility] [place], [place] [id number] Rush Farmer MD [id number]   Troponin I (q 6hr x 3)     Status: None   Collection Time: 03/13/18  6:08 AM  Result Value Ref Range   Troponin I <0.03 <0.03 ng/mL    Comment: Performed at [facility] [address], [place], [place] [address]  Basic metabolic panel     Status: Abnormal   Collection Time: 03/13/18  6:08 AM  Result Value Ref Range   Sodium 132 (L) 135 - 145 mmol/L   Potassium 3.6 3.5 - 5.1 mmol/L   Chloride 101 98 - 111 mmol/L   CO2 21 (L) 22 - 32 mmol/L   Glucose, Bld 113 (H) 70 - 99 mg/dL   BUN 9 6 - 20 mg/dL   Creatinine, Ser 1.02 0.61 - 1.24 mg/dL   Calcium 8.6 (L) 8.9 - 10.3 mg/dL   GFR calc non Af Amer >60 >60 mL/min   GFR calc Af Amer >60 >60 mL/min    Comment: (NOTE) The eGFR has been calculated using the CKD EPI equation. This calculation has not been validated in all clinical situations. eGFR's persistently <60 mL/min signify possible Chronic Kidney Disease.    Anion gap 10 5 - 15    Comment: Performed at [facility] [address], [place] [address]  CBC     Status: None   Collection Time: 03/13/18  6:08 AM  Result Value Ref Range   WBC 6.6 4.0 - 10.5 K/uL   RBC 4.93 4.22 - 5.81 MIL/uL   Hemoglobin 13.6 13.0 - 17.0 g/dL   HCT 41.8 39.0 - 52.0 %   MCV 84.8 78.0 - 100.0 fL   MCH 27.6 26.0 - 34.0 pg   MCHC 32.5 30.0 - 36.0 g/dL   RDW 11.9  11.5 - 15.5 %   Platelets 260 150 - 400 K/uL    Comment: Performed at [facility], [place] [address] [place], [place] [address]  Rapid urine drug screen (hospital performed)     Status: Abnormal   Collection Time: 03/13/18  7:44  AM  Result Value Ref Range   Opiates NONE DETECTED NONE DETECTED   Cocaine POSITIVE (A) NONE DETECTED   Benzodiazepines NONE DETECTED NONE DETECTED   Amphetamines NONE DETECTED NONE DETECTED   Tetrahydrocannabinol NONE DETECTED NONE DETECTED   Barbiturates NONE DETECTED NONE DETECTED    Comment: (NOTE) DRUG SCREEN FOR MEDICAL PURPOSES ONLY.  IF CONFIRMATION IS NEEDED FOR ANY PURPOSE, NOTIFY LAB WITHIN 5 DAYS. LOWEST DETECTABLE LIMITS FOR URINE DRUG SCREEN Drug Class                     Cutoff (ng/mL) Amphetamine and metabolites    1000 Barbiturate and metabolites    200 Benzodiazepine                 245 Tricyclics and metabolites     300 Opiates and metabolites        300 Cocaine and metabolites        300 THC                            50 Performed at [facility], [place] [address] [place], [place] [address]   Troponin I (q 6hr x 3)     Status: None   Collection Time: 03/13/18 12:53 PM  Result Value Ref Range   Troponin I <0.03 <0.03 ng/mL    Comment: Performed at [facility] [address], [place], [place] [address]    Dg Chest 2 View  Result Date: 03/12/2018 CLINICAL DATA:  Epigastric pain EXAM: CHEST - 2 VIEW COMPARISON:  PET CT 12/16/2012 FINDINGS: No focal opacity or pleural effusion. Normal heart size. Fullness in the hilar regions likely represent nodes, slightly less prominent as compared with prior radiograph. No pneumothorax IMPRESSION: No active cardiopulmonary disease. Bilateral hilar fullness, likely corresponds to lymphadenopathy. This appears slightly decreased as compared with 2014 radiograph. Electronically Signed   By: Donavan Foil M.D.   On: 03/12/2018 17:51   Nm Hepato W/eject Fract  Result Date:  03/13/2018 CLINICAL DATA:  Gallstones and gallbladder wall thickening. Right upper quadrant pain EXAM: NUCLEAR MEDICINE HEPATOBILIARY IMAGING TECHNIQUE: Sequential images of the abdomen were obtained out to 60 minutes following intravenous administration of radiopharmaceutical. RADIOPHARMACEUTICALS:  5.0 mCi Tc-55m Choletec IV COMPARISON:  03/13/2018 FINDINGS: Prompt uptake and biliary excretion of activity by the liver is seen. In the first hour no gallbladder activity visualized. After 90 minutes morphine sulfate was administered intravenously and subsequent imaging was performed without gallbladder visualization. Biliary activity passes into small bowel, consistent with patent common bile duct. IMPRESSION: 1. Nonvisualization of the gallbladder compatible with cystic duct obstruction and acute cholecystitis. 2. Biliary activity passes into the small bowel consistent with patent common bile duct. Electronically Signed   By: TKerby MoorsM.D.   On: 03/13/2018 17:20   Ct Angio Chest Aorta W/cm &/or Wo/cm  Result Date: 03/13/2018 CLINICAL DATA:  Nausea and midline chest pain today. History of mediastinal and hilar lymphadenopathy. EXAM: CT ANGIOGRAPHY CHEST WITH CONTRAST TECHNIQUE: Multidetector CT imaging of the chest was performed using the standard protocol during bolus administration of intravenous contrast. Multiplanar CT image reconstructions and MIPs were obtained to evaluate the vascular anatomy. CONTRAST:  1028mISOVUE-370 IOPAMIDOL (ISOVUE-370) INJECTION 76% COMPARISON:  Chest CT 11/22/2012 and PET-CT 12/16/2012 FINDINGS: Cardiovascular: The heart is normal in size. No pericardial effusion. The aorta is normal in caliber. No atherosclerotic calcifications. No dissection. The branch vessels  are patent. Scattered coronary artery calcifications are noted. The pulmonary arteries appear normal. No findings to suggest pulmonary embolism. Mediastinum/Nodes: Stable appearing mediastinal and hilar  lymphadenopathy since 2014. 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 The esophagus is grossly normal. Lungs/Pleura: No acute pulmonary findings. No worrisome pulmonary lesions. Minimal dependent subpleural atelectasis. No findings to suggest interstitial lung disease or bronchiectasis. Upper Abdomen: No significant upper abdominal findings. The upper abdominal aorta is unremarkable and visualized branch vessels appear normal. Chest wall/musculoskeletal: No chest wall mass, supraclavicular or axillary lymphadenopathy. Minimal stable gynecomastia. The thyroid gland appears normal. The bony thorax is intact.  No bone lesions or fracture. Review of the MIP images confirms the above findings. IMPRESSION: 1. Normal CT appearance of the thoracic aorta. No aneurysm or dissection. 2. The pulmonary arteries appear normal. No findings for pulmonary embolism. 3. No acute pulmonary findings. 4. Stable symmetric bulky mediastinal and hilar lymphadenopathy since 2014, most likely due to sarcoidosis. Electronically Signed   By: Marijo Sanes M.D.   On: 03/13/2018 10:01   US Abdomen Limited Ruq  Result Date: 03/13/2018 CLINICAL DATA:  Right upper quadrant pain EXAM: ULTRASOUND ABDOMEN LIMITED RIGHT UPPER QUADRANT COMPARISON:  PET-CT December 16, 2012 FINDINGS: Gallbladder: Within the gallbladder, there are multiple echogenic foci which move and shadow consistent with cholelithiasis. Largest gallstone measures 1.9 cm in length. Gallbladder wall is thickened and edematous. There is no appreciable pericholecystic fluid. No sonographic Murphy sign noted by sonographer. Common bile duct: Diameter: 3 mm. No intrahepatic or extrahepatic biliary duct dilatation. Liver: No focal lesion identified. Within normal limits in parenchymal echogenicity. Portal vein is patent on color Doppler imaging with normal direction of blood flow towards the liver. IMPRESSION: Gallstones with thickened, edematous gallbladder wall. These are findings felt  to be indicative of acute cholecystitis. Study otherwise unremarkable. These results will be called to the ordering clinician or representative by the Radiologist Assistant, and communication documented in the PACS or zVision Dashboard. Electronically Signed   By: Lowella Grip III M.D.   On: 03/13/2018 12:31    Review of Systems  Constitutional: Negative for chills and fever.  HENT: Negative for hearing loss.   Eyes: Negative for blurred vision and double vision.  Respiratory: Negative for cough and hemoptysis.   Cardiovascular: Positive for chest pain. Negative for palpitations.  Gastrointestinal: Positive for abdominal pain. Negative for nausea and vomiting.  Genitourinary: Negative for dysuria and urgency.  Musculoskeletal: Negative for myalgias and neck pain.  Skin: Negative for itching and rash.  Neurological: Negative for dizziness, tingling and headaches.  Endo/Heme/Allergies: Does not bruise/bleed easily.  Psychiatric/Behavioral: Negative for depression and suicidal ideas.   Blood pressure 124/79, pulse (!) 107, temperature 98.4 F (36.9 C), temperature source Oral, resp. rate 18, height 5' 6" (1.676 m), weight 68 kg, SpO2 98 %. Physical Exam  Vitals reviewed. Constitutional: He is oriented to person, place, and time. He appears well-developed and well-nourished.  HENT:  Head: Normocephalic and atraumatic.  Eyes: Pupils are equal, round, and reactive to light. Conjunctivae and EOM are normal.  Neck: Normal range of motion. Neck supple.  Cardiovascular: Normal rate and regular rhythm.  Respiratory: Effort normal and breath sounds normal.  GI: Soft. Bowel sounds are normal. He exhibits no distension. There is tenderness in the right upper quadrant.  Musculoskeletal: Normal range of motion.  Neurological: He is alert and oriented to person, place, and time.  Skin: Skin is warm and dry.  Psychiatric: He has a normal mood and affect. His  behavior is normal.       Assessment/Plan: 52 yo male who recently had epigastric pain and is still somewhat tender on exam. No leukocytosis, HIDA scan with nonfilling gallbladder consistent with cholecystitis and [place] with large gallstones and thickened gallbladder wall. -We discussed the etiology of her pain, we discussed treatment options and recommended surgery. We discussed details of surgery including general anesthesia, laparoscopic approach, identification of cystic duct and common bile duct. Ligation of cystic duct and cystic artery. Possible need for intraoperative cholangiogram or open procedure. Possible risks of common bile duct injury, liver injury, cystic duct leak, bleeding, infection, post-cholecystectomy syndrome. The patient showed good understanding and all questions were answered. He would like to talk his options over with his family   Arta Bruce Kinsinger 03/13/2018, 10:50 PM

## 2018-03-13 NOTE — Progress Notes (Signed)
Spoke with Eric Thornton. in molecular imaging. She states "no longer need IV." Notified that consult will be completed. VU. Tomasita Morrow, RN VAST

## 2018-03-14 ENCOUNTER — Encounter (HOSPITAL_COMMUNITY)
Admission: EM | Disposition: A | Discharge status: Home / Self Care | Payer: Self-pay | Source: Home / Self Care | Attending: Internal Medicine

## 2018-03-14 ENCOUNTER — Inpatient Hospital Stay (HOSPITAL_COMMUNITY): Payer: Self-pay | Admitting: Certified Registered"

## 2018-03-14 ENCOUNTER — Inpatient Hospital Stay (HOSPITAL_COMMUNITY): Payer: Self-pay

## 2018-03-14 DIAGNOSIS — R079 Chest pain, unspecified: Secondary | ICD-10-CM

## 2018-03-14 HISTORY — PX: CHOLECYSTECTOMY: SHX55

## 2018-03-14 LAB — CBC WITH DIFFERENTIAL/PLATELET
Abs Immature Granulocytes: 0 10*3/uL (ref 0.0–0.1)
BASOS ABS: 0 10*3/uL (ref 0.0–0.1)
BASOS PCT: 0 %
EOS ABS: 0.1 10*3/uL (ref 0.0–0.7)
Eosinophils Relative: 2 %
HCT: 40.7 % (ref 39.0–52.0)
Hemoglobin: 13.3 g/dL (ref 13.0–17.0)
Immature Granulocytes: 0 %
Lymphocytes Relative: 9 %
Lymphs Abs: 0.5 10*3/uL — ABNORMAL LOW (ref 0.7–4.0)
MCH: 27.8 pg (ref 26.0–34.0)
MCHC: 32.7 g/dL (ref 30.0–36.0)
MCV: 85.1 fL (ref 78.0–100.0)
MONO ABS: 0.8 10*3/uL (ref 0.1–1.0)
MONOS PCT: 13 %
Neutro Abs: 4.5 10*3/uL (ref 1.7–7.7)
Neutrophils Relative %: 76 %
Platelets: 252 10*3/uL (ref 150–400)
RBC: 4.78 MIL/uL (ref 4.22–5.81)
RDW: 11.9 % (ref 11.5–15.5)
WBC: 5.9 10*3/uL (ref 4.0–10.5)

## 2018-03-14 LAB — HEMOGLOBIN A1C
HEMOGLOBIN A1C: 5.3 % (ref 4.8–5.6)
MEAN PLASMA GLUCOSE: 105 mg/dL

## 2018-03-14 LAB — COMPREHENSIVE METABOLIC PANEL
ALBUMIN: 3.1 g/dL — AB (ref 3.5–5.0)
ALK PHOS: 65 U/L (ref 38–126)
ALT: 34 U/L (ref 0–44)
ANION GAP: 5 (ref 5–15)
AST: 31 U/L (ref 15–41)
BILIRUBIN TOTAL: 0.9 mg/dL (ref 0.3–1.2)
BUN: 11 mg/dL (ref 6–20)
CALCIUM: 8.3 mg/dL — AB (ref 8.9–10.3)
CO2: 25 mmol/L (ref 22–32)
Chloride: 102 mmol/L (ref 98–111)
Creatinine, Ser: 1.23 mg/dL (ref 0.61–1.24)
GFR calc Af Amer: >60 mL/min (ref >60)
GFR calc non Af Amer: >60 mL/min (ref >60)
GLUCOSE: 99 mg/dL (ref 70–99)
Potassium: 4 mmol/L (ref 3.5–5.1)
Sodium: 132 mmol/L — ABNORMAL LOW (ref 135–145)
TOTAL PROTEIN: 7.1 g/dL (ref 6.5–8.1)

## 2018-03-14 LAB — PROTIME-INR
INR: 1.16
Prothrombin Time: 14.7 seconds (ref 11.4–15.2)

## 2018-03-14 LAB — TYPE AND SCREEN
ABO/RH(D): B POS
Antibody Screen: NEGATIVE

## 2018-03-14 LAB — SURGICAL PCR SCREEN
MRSA, PCR: NEGATIVE
Staphylococcus aureus: POSITIVE — AB

## 2018-03-14 LAB — ABO/RH: ABO/RH(D): B POS

## 2018-03-14 LAB — MAGNESIUM: MAGNESIUM: 1.7 mg/dL (ref 1.7–2.4)

## 2018-03-14 SURGERY — LAPAROSCOPIC CHOLECYSTECTOMY WITH INTRAOPERATIVE CHOLANGIOGRAM
Anesthesia: General | Site: Abdomen

## 2018-03-14 MED ORDER — OXYCODONE HCL 5 MG PO TABS: 5.0000 mg | ORAL_TABLET | ORAL | Status: DC | PRN

## 2018-03-14 MED ORDER — ONDANSETRON HCL 4 MG/2ML IJ SOLN
INTRAMUSCULAR | Status: AC
  Filled 2018-03-14: qty 4

## 2018-03-14 MED ORDER — FENTANYL CITRATE (PF) 250 MCG/5ML IJ SOLN
INTRAMUSCULAR | Status: AC
  Filled 2018-03-14: qty 5

## 2018-03-14 MED ORDER — ROCURONIUM BROMIDE 50 MG/5ML IV SOSY
PREFILLED_SYRINGE | INTRAVENOUS | Status: AC
  Filled 2018-03-14: qty 5

## 2018-03-14 MED ORDER — EPHEDRINE 5 MG/ML INJ
INTRAVENOUS | Status: AC
  Filled 2018-03-14: qty 10

## 2018-03-14 MED ORDER — PROPOFOL 10 MG/ML IV BOLUS
INTRAVENOUS | Status: DC | PRN
  Administered 2018-03-14: 30 mg via INTRAVENOUS
  Administered 2018-03-14: 170 mg via INTRAVENOUS
  Administered 2018-03-14: 30 mg via INTRAVENOUS

## 2018-03-14 MED ORDER — HYDROCODONE-ACETAMINOPHEN 7.5-325 MG PO TABS: 1.0000 | ORAL_TABLET | Freq: Once | ORAL | Status: DC | PRN

## 2018-03-14 MED ORDER — IOPAMIDOL (ISOVUE-300) INJECTION 61%
INTRAVENOUS | Status: AC
  Filled 2018-03-14: qty 50

## 2018-03-14 MED ORDER — MUPIROCIN 2 % EX OINT: 1.0000 "application " | TOPICAL_OINTMENT | Freq: Two times a day (BID) | CUTANEOUS | Status: DC

## 2018-03-14 MED ORDER — MIDAZOLAM HCL 2 MG/2ML IJ SOLN
INTRAMUSCULAR | Status: AC
  Filled 2018-03-14: qty 2

## 2018-03-14 MED ORDER — HYDROMORPHONE HCL 1 MG/ML IJ SOLN: 0.2500 mg | INTRAMUSCULAR | Status: DC | PRN

## 2018-03-14 MED ORDER — 0.9 % SODIUM CHLORIDE (POUR BTL) OPTIME
TOPICAL | Status: DC | PRN
  Administered 2018-03-14: 1000 mL

## 2018-03-14 MED ORDER — ACETAMINOPHEN 10 MG/ML IV SOLN: 1000.0000 mg | Freq: Once | INTRAVENOUS | Status: DC | PRN

## 2018-03-14 MED ORDER — BUPIVACAINE-EPINEPHRINE (PF) 0.25% -1:200000 IJ SOLN
INTRAMUSCULAR | Status: AC
  Filled 2018-03-14: qty 30

## 2018-03-14 MED ORDER — ACETAMINOPHEN 325 MG PO TABS: 650.0000 mg | ORAL_TABLET | Freq: Four times a day (QID) | ORAL | Status: DC | PRN

## 2018-03-14 MED ORDER — MEPERIDINE HCL 50 MG/ML IJ SOLN: 6.2500 mg | INTRAMUSCULAR | Status: DC | PRN

## 2018-03-14 MED ORDER — LIDOCAINE 2% (20 MG/ML) 5 ML SYRINGE
INTRAMUSCULAR | Status: DC | PRN
  Administered 2018-03-14: 60 mg via INTRAVENOUS

## 2018-03-14 MED ORDER — ONDANSETRON HCL 4 MG/2ML IJ SOLN
INTRAMUSCULAR | Status: DC | PRN
  Administered 2018-03-14: 4 mg via INTRAVENOUS

## 2018-03-14 MED ORDER — LABETALOL HCL 5 MG/ML IV SOLN
INTRAVENOUS | Status: DC | PRN
  Administered 2018-03-14: 5 mg via INTRAVENOUS

## 2018-03-14 MED ORDER — LABETALOL HCL 5 MG/ML IV SOLN
INTRAVENOUS | Status: AC
  Filled 2018-03-14: qty 4

## 2018-03-14 MED ORDER — MIDAZOLAM HCL 5 MG/5ML IJ SOLN
INTRAMUSCULAR | Status: DC | PRN
  Administered 2018-03-14: 2 mg via INTRAVENOUS

## 2018-03-14 MED ORDER — DEXAMETHASONE SODIUM PHOSPHATE 10 MG/ML IJ SOLN
INTRAMUSCULAR | Status: AC
  Filled 2018-03-14: qty 1

## 2018-03-14 MED ORDER — LIDOCAINE 2% (20 MG/ML) 5 ML SYRINGE
INTRAMUSCULAR | Status: AC
  Filled 2018-03-14: qty 5

## 2018-03-14 MED ORDER — SODIUM CHLORIDE 0.9 % IR SOLN
Status: DC | PRN
  Administered 2018-03-14: 1000 mL

## 2018-03-14 MED ORDER — CHLORHEXIDINE GLUCONATE CLOTH 2 % EX PADS: 6.0000 | MEDICATED_PAD | Freq: Every day | CUTANEOUS | Status: DC

## 2018-03-14 MED ORDER — ROCURONIUM BROMIDE 10 MG/ML (PF) SYRINGE
PREFILLED_SYRINGE | INTRAVENOUS | Status: DC | PRN
  Administered 2018-03-14: 50 mg via INTRAVENOUS

## 2018-03-14 MED ORDER — SODIUM CHLORIDE 0.9 % IV SOLN
INTRAVENOUS | Status: DC | PRN
  Administered 2018-03-14: 7 mL

## 2018-03-14 MED ORDER — PROMETHAZINE HCL 25 MG/ML IJ SOLN: 6.2500 mg | INTRAMUSCULAR | Status: DC | PRN

## 2018-03-14 MED ORDER — EPINEPHRINE PF 1 MG/10ML IJ SOSY
PREFILLED_SYRINGE | INTRAMUSCULAR | Status: AC
  Filled 2018-03-14: qty 10

## 2018-03-14 MED ORDER — BUPIVACAINE-EPINEPHRINE 0.25% -1:200000 IJ SOLN
INTRAMUSCULAR | Status: DC | PRN
  Administered 2018-03-14: 10 mL

## 2018-03-14 MED ORDER — MORPHINE SULFATE (PF) 2 MG/ML IV SOLN: 1.0000 mg | INTRAVENOUS | Status: DC | PRN

## 2018-03-14 MED ORDER — SUGAMMADEX SODIUM 200 MG/2ML IV SOLN
INTRAVENOUS | Status: DC | PRN
  Administered 2018-03-14: 200 mg via INTRAVENOUS

## 2018-03-14 MED ORDER — LACTATED RINGERS IV SOLN
INTRAVENOUS | Status: DC
  Administered 2018-03-14: 14:00:00 via INTRAVENOUS

## 2018-03-14 MED ORDER — FENTANYL CITRATE (PF) 250 MCG/5ML IJ SOLN
INTRAMUSCULAR | Status: DC | PRN
  Administered 2018-03-14: 100 ug via INTRAVENOUS
  Administered 2018-03-14 (×2): 25 ug via INTRAVENOUS

## 2018-03-14 MED ORDER — MUPIROCIN 2 % EX OINT
1.0000 "application " | TOPICAL_OINTMENT | Freq: Two times a day (BID) | CUTANEOUS | Status: DC
  Administered 2018-03-14 – 2018-03-15 (×3): 1 via NASAL
  Filled 2018-03-14: qty 22

## 2018-03-14 MED ORDER — CHLORHEXIDINE GLUCONATE CLOTH 2 % EX PADS
6.0000 | MEDICATED_PAD | Freq: Every day | CUTANEOUS | Status: DC
  Administered 2018-03-14 – 2018-03-15 (×2): 6 via TOPICAL

## 2018-03-14 MED ORDER — PHENYLEPHRINE 40 MCG/ML (10ML) SYRINGE FOR IV PUSH (FOR BLOOD PRESSURE SUPPORT)
PREFILLED_SYRINGE | INTRAVENOUS | Status: DC | PRN
  Administered 2018-03-14: 80 ug via INTRAVENOUS

## 2018-03-14 MED ORDER — DEXAMETHASONE SODIUM PHOSPHATE 10 MG/ML IJ SOLN
INTRAMUSCULAR | Status: DC | PRN
  Administered 2018-03-14: 10 mg via INTRAVENOUS

## 2018-03-14 SURGICAL SUPPLY — 50 items
APPLIER CLIP ROT 10 11.4 M/L (STAPLE) ×3
APR CLP MED LRG 11.4X10 (STAPLE) ×1
BAG SPEC RTRVL 10 TROC 200 (ENDOMECHANICALS) ×1
BAG SPEC RTRVL LRG 6X4 10 (ENDOMECHANICALS)
BENZOIN TINCTURE PRP APPL 2/3 (GAUZE/BANDAGES/DRESSINGS) ×3 IMPLANT
BLADE CLIPPER SURG (BLADE) ×3 IMPLANT
CANISTER SUCT 3000ML PPV (MISCELLANEOUS) ×3 IMPLANT
CHLORAPREP W/TINT 26ML (MISCELLANEOUS) ×3 IMPLANT
CLIP APPLIE ROT 10 11.4 M/L (STAPLE) ×1 IMPLANT
CLOSURE WOUND 1/2 X4 (GAUZE/BANDAGES/DRESSINGS) ×1
COVER MAYO STAND STRL (DRAPES) ×3 IMPLANT
COVER SURGICAL LIGHT HANDLE (MISCELLANEOUS) ×3 IMPLANT
COVER WAND RF STERILE (DRAPES) ×3 IMPLANT
DRAPE C-ARM 42X72 X-RAY (DRAPES) ×3 IMPLANT
DRSG TEGADERM 2-3/8X2-3/4 SM (GAUZE/BANDAGES/DRESSINGS) ×3 IMPLANT
DRSG TEGADERM 4X4.75 (GAUZE/BANDAGES/DRESSINGS) ×3 IMPLANT
ELECT REM PT RETURN 9FT ADLT (ELECTROSURGICAL) ×3
ELECTRODE REM PT RTRN 9FT ADLT (ELECTROSURGICAL) ×1 IMPLANT
FILTER SMOKE EVAC LAPAROSHD (FILTER) ×3 IMPLANT
GAUZE SPONGE 2X2 8PLY STRL LF (GAUZE/BANDAGES/DRESSINGS) ×1 IMPLANT
GLOVE BIO SURGEON STRL SZ7 (GLOVE) ×6 IMPLANT
GLOVE BIO SURGEON STRL SZ7.5 (GLOVE) ×3 IMPLANT
GLOVE BIOGEL PI IND STRL 7.5 (GLOVE) ×2 IMPLANT
GLOVE BIOGEL PI INDICATOR 7.5 (GLOVE) ×4
GOWN STRL REUS W/ TWL LRG LVL3 (GOWN DISPOSABLE) ×5 IMPLANT
GOWN STRL REUS W/TWL LRG LVL3 (GOWN DISPOSABLE) ×15
KIT BASIN OR (CUSTOM PROCEDURE TRAY) ×3 IMPLANT
KIT TURNOVER KIT B (KITS) ×3 IMPLANT
NS IRRIG 1000ML POUR BTL (IV SOLUTION) ×3 IMPLANT
PAD ARMBOARD 7.5X6 YLW CONV (MISCELLANEOUS) ×3 IMPLANT
POUCH RETRIEVAL ECOSAC 10 (ENDOMECHANICALS) ×1 IMPLANT
POUCH RETRIEVAL ECOSAC 10MM (ENDOMECHANICALS) ×2
POUCH SPECIMEN RETRIEVAL 10MM (ENDOMECHANICALS) IMPLANT
SCISSORS LAP 5X35 DISP (ENDOMECHANICALS) ×3 IMPLANT
SET CHOLANGIOGRAPH 5 50 .035 (SET/KITS/TRAYS/PACK) ×3 IMPLANT
SET IRRIG TUBING LAPAROSCOPIC (IRRIGATION / IRRIGATOR) ×3 IMPLANT
SLEEVE ENDOPATH XCEL 5M (ENDOMECHANICALS) ×3 IMPLANT
SPECIMEN JAR SMALL (MISCELLANEOUS) ×3 IMPLANT
SPONGE GAUZE 2X2 STER 10/PKG (GAUZE/BANDAGES/DRESSINGS) ×2
STRIP CLOSURE SKIN 1/2X4 (GAUZE/BANDAGES/DRESSINGS) ×2 IMPLANT
SUT MNCRL AB 4-0 PS2 18 (SUTURE) ×3 IMPLANT
SUT VICRYL 0 UR6 27IN ABS (SUTURE) ×3 IMPLANT
TOWEL OR 17X24 6PK STRL BLUE (TOWEL DISPOSABLE) ×3 IMPLANT
TOWEL OR 17X26 10 PK STRL BLUE (TOWEL DISPOSABLE) IMPLANT
TRAY LAPAROSCOPIC MC (CUSTOM PROCEDURE TRAY) ×3 IMPLANT
TROCAR XCEL BLUNT TIP 100MML (ENDOMECHANICALS) ×3 IMPLANT
TROCAR XCEL NON-BLD 11X100MML (ENDOMECHANICALS) ×3 IMPLANT
TROCAR XCEL NON-BLD 5MMX100MML (ENDOMECHANICALS) ×3 IMPLANT
TUBING INSUFFLATION (TUBING) ×3 IMPLANT
WATER STERILE IRR 1000ML POUR (IV SOLUTION) ×3 IMPLANT

## 2018-03-14 NOTE — Progress Notes (Addendum)
Progress Note  Patient Name: Eric Thornton Date of Encounter: 03/14/2018  Primary Cardiologist: New  Subjective   Patient is anticipating cholecystectomy. He denies recurrent chest pain or SOB. Still with some RUQ pain. No nausea/vomiting.   Inpatient Medications    Scheduled Meds: . amLODipine  10 mg Oral Daily  . aspirin  324 mg Oral Daily  . atorvastatin  40 mg Oral q1800  . enoxaparin (LOVENOX) injection  40 mg Subcutaneous Q24H  . famotidine  20 mg Oral BID  . losartan  25 mg Oral Daily   Continuous Infusions: . sodium chloride Stopped (03/12/18 1831)  . cefTRIAXone (ROCEPHIN)  IV Stopped (03/13/18 1930)   PRN Meds: acetaminophen **OR** acetaminophen, bismuth subsalicylate, hydrALAZINE, nitroGLYCERIN, ondansetron **OR** ondansetron (ZOFRAN) IV, senna-docusate, zolpidem   Vital Signs    Vitals:   03/12/18 2353 03/13/18 0556 03/13/18 2044 03/14/18 0610  BP: (!) 179/76 (!) 158/92 124/79 124/75  Pulse: (!) 57 98 (!) 107 94  Resp: 20 19 18 18   Temp: 97.8 F (36.6 C) 99 F (37.2 C) 98.4 F (36.9 C) 98.1 F (36.7 C)  TempSrc: Oral Oral Oral Oral  SpO2: 100% 96% 98% 98%  Weight:    72.7 kg  Height:        Intake/Output Summary (Last 24 hours) at 03/14/2018 0749 Last data filed at 03/13/2018 2200 Gross per 24 hour  Intake 220 ml  Output -  Net 220 ml   Filed Weights   03/12/18 1801 03/14/18 0610  Weight: 68 kg 72.7 kg    Telemetry    Sinus rhythm with sinus tachycardia - Personally Reviewed  Physical Exam   GEN: laying in bed in no acute distress.   Neck: No JVD, no carotid bruits Cardiac: RRR, no murmurs, rubs, or gallops.  Respiratory: Clear to auscultation bilaterally, no wheezes/ rales/ rhonchi GI: NABS, Soft, mild TTP in RUQ, non-distended  MS: No edema; No deformity. Neuro:  Nonfocal, moving all extremities spontaneously Psych: Normal affect   Labs    Chemistry Recent Labs  Lab 03/12/18 1717 03/12/18 1829 03/13/18 0608  03/14/18 0506  NA 139 140 132* 132*  K 4.4 3.5 3.6 4.0  CL 104 103 101 102  CO2 24  --  21* 25  GLUCOSE 98 107* 113* 99  BUN 10 10 9 11   CREATININE 1.37* 1.30* 1.02 1.23  CALCIUM 9.2  --  8.6* 8.3*  PROT 7.9  --   --  7.1  ALBUMIN 3.8  --   --  3.1*  AST 30  --   --  31  ALT 28  --   --  34  ALKPHOS 62  --   --  65  BILITOT 0.6  --   --  0.9  GFRNONAA 57*  --  >60 >60  GFRAA >60  --  >60 >60  ANIONGAP 11  --  10 5     Hematology Recent Labs  Lab 03/12/18 1717 03/12/18 1829 03/13/18 0608 03/14/18 0506  WBC 3.7*  --  6.6 5.9  RBC 4.76  --  4.93 4.78  HGB 13.3 13.3 13.6 13.3  HCT 42.0 39.0 41.8 40.7  MCV 88.2  --  84.8 85.1  MCH 27.9  --  27.6 27.8  MCHC 31.7  --  32.5 32.7  RDW 12.1  --  11.9 11.9  PLT 267  --  260 252    Cardiac Enzymes Recent Labs  Lab 03/12/18 1717 03/13/18 0020 03/13/18 0608 03/13/18 1253  TROPONINI <0.03 <0.03 <0.03 <0.03    Recent Labs  Lab 03/12/18 1827  TROPIPOC 0.01     BNPNo results for input(s): BNP, PROBNP in the last 168 hours.   DDimer No results for input(s): DDIMER in the last 168 hours.   Radiology    Dg Chest 2 View  Result Date: 03/12/2018 CLINICAL DATA:  Epigastric pain EXAM: CHEST - 2 VIEW COMPARISON:  PET CT 12/16/2012 FINDINGS: No focal opacity or pleural effusion. Normal heart size. Fullness in the hilar regions likely represent nodes, slightly less prominent as compared with prior radiograph. No pneumothorax IMPRESSION: No active cardiopulmonary disease. Bilateral hilar fullness, likely corresponds to lymphadenopathy. This appears slightly decreased as compared with 2014 radiograph. Electronically Signed   By: Jasmine Pang M.D.   On: 03/12/2018 17:51   Nm Hepato W/eject Fract  Result Date: 03/13/2018 CLINICAL DATA:  Gallstones and gallbladder wall thickening. Right upper quadrant pain EXAM: NUCLEAR MEDICINE HEPATOBILIARY IMAGING TECHNIQUE: Sequential images of the abdomen were obtained out to 60 minutes  following intravenous administration of radiopharmaceutical. RADIOPHARMACEUTICALS:  5.0 mCi Tc-77m  Choletec IV COMPARISON:  03/13/2018 FINDINGS: Prompt uptake and biliary excretion of activity by the liver is seen. In the first hour no gallbladder activity visualized. After 90 minutes morphine sulfate was administered intravenously and subsequent imaging was performed without gallbladder visualization. Biliary activity passes into small bowel, consistent with patent common bile duct. IMPRESSION: 1. Nonvisualization of the gallbladder compatible with cystic duct obstruction and acute cholecystitis. 2. Biliary activity passes into the small bowel consistent with patent common bile duct. Electronically Signed   By: Signa Kell M.D.   On: 03/13/2018 17:20   Ct Angio Chest Aorta W/cm &/or Wo/cm  Result Date: 03/13/2018 CLINICAL DATA:  Nausea and midline chest pain today. History of mediastinal and hilar lymphadenopathy. EXAM: CT ANGIOGRAPHY CHEST WITH CONTRAST TECHNIQUE: Multidetector CT imaging of the chest was performed using the standard protocol during bolus administration of intravenous contrast. Multiplanar CT image reconstructions and MIPs were obtained to evaluate the vascular anatomy. CONTRAST:  ISOVUE-370 IOPAMIDOL (ISOVUE-370) INJECTION 76% COMPARISON:  Chest CT 11/22/2012 and PET-CT 12/16/2012 FINDINGS: Cardiovascular: The heart is normal in size. No pericardial effusion. The aorta is normal in caliber. No atherosclerotic calcifications. No dissection. The branch vessels are patent. Scattered coronary artery calcifications are noted. The pulmonary arteries appear normal. No findings to suggest pulmonary embolism. Mediastinum/Nodes: Stable appearing mediastinal and hilar lymphadenopathy since 2014. 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 The esophagus is grossly normal. Lungs/Pleura: No acute pulmonary findings. No worrisome pulmonary lesions. Minimal dependent subpleural atelectasis. No  findings to suggest interstitial lung disease or bronchiectasis. Upper Abdomen: No significant upper abdominal findings. The upper abdominal aorta is unremarkable and visualized branch vessels appear normal. Chest wall/musculoskeletal: No chest wall mass, supraclavicular or axillary lymphadenopathy. Minimal stable gynecomastia. The thyroid gland appears normal. The bony thorax is intact.  No bone lesions or fracture. Review of the MIP images confirms the above findings. IMPRESSION: 1. Normal CT appearance of the thoracic aorta. No aneurysm or dissection. 2. The pulmonary arteries appear normal. No findings for pulmonary embolism. 3. No acute pulmonary findings. 4. Stable symmetric bulky mediastinal and hilar lymphadenopathy since 2014, most likely due to sarcoidosis. Electronically Signed   By: Rudie Meyer M.D.   On: 03/13/2018 10:01   US Abdomen Limited Ruq  Result Date: 03/13/2018 CLINICAL DATA:  Right upper quadrant pain EXAM: ULTRASOUND ABDOMEN LIMITED RIGHT UPPER QUADRANT COMPARISON:  PET-CT December 16, 2012 FINDINGS: Gallbladder:  Within the gallbladder, there are multiple echogenic foci which move and shadow consistent with cholelithiasis. Largest gallstone measures 1.9 cm in length. Gallbladder wall is thickened and edematous. There is no appreciable pericholecystic fluid. No sonographic Murphy sign noted by sonographer. Common bile duct: Diameter: 3 mm. No intrahepatic or extrahepatic biliary duct dilatation. Liver: No focal lesion identified. Within normal limits in parenchymal echogenicity. Portal vein is patent on color Doppler imaging with normal direction of blood flow towards the liver. IMPRESSION: Gallstones with thickened, edematous gallbladder wall. These are findings felt to be indicative of acute cholecystitis. Study otherwise unremarkable. These results will be called to the ordering clinician or representative by the Radiologist Assistant, and communication documented in the PACS or zVision  Dashboard. Electronically Signed   By: Bretta Bang III M.D.   On: 03/13/2018 12:31    Cardiac Studies    Echocardiogram 03/13/18: Study Conclusions  - Left ventricle: The cavity size was normal. Wall thickness was   normal. Systolic function was normal. The estimated ejection   fraction was in the range of 55% to 60%. Wall motion was normal;   there were no regional wall motion abnormalities. Doppler   parameters are consistent with abnormal left ventricular   relaxation (grade 1 diastolic dysfunction). - Aortic valve: There was no stenosis. - Mitral valve: There was trivial regurgitation. - Right ventricle: The cavity size was normal. Systolic function   was normal. - Atrial septum: Mobile septum, cannot rule out small PFO. - Pulmonary arteries: No complete TR doppler jet so unable to   estimate PA systolic pressure. - Inferior vena cava: The vessel was normal in size. The   respirophasic diameter changes were in the normal range (>= 50%),   consistent with normal central venous pressure.  Impressions:  - Normal LV size with EF 55-60%. Normal RV size and systolic   function. No significant valvular abnormalities.  Patient Profile     52 y.o. male with a PMH of HTN, cocaine abuse, mediastinal lymphadenopathy (lost to follow-up with Dr. Arbutus Ped), who presented with chest pain for which cardiology is following  Assessment & Plan    1. Atypical chest pain: patient presented with lower chest/epigastric pain in the setting of hypertensive urgency, recent cocaine use, and N/V 2/2 cholelithiasis. CTA chest without aortic dissection/PE. Echo yesterday revealed EF 55-60%, G1DD, no significant valvular abnormalities. Troponins were negative and EKG with LVH with strain pattern. RUQ [place] yesterday revealed cholelithiasis and gallbladder wall thickening for which surgery is recommending CCY. - No further cardiac work-up at this time.   2. RUQ/epigastric pain: RUQ [place] yesterday  revealed cholelithiasis and gallbladder wall thickening for which surgery is recommending CCY. - Continue management per primary team/surgery  3. Hypertensive urgency: BP much improved this morning.  - Continue amlodipine and losartan  4. HLD: LDL 131 this admission - Continue atorvastatin  5. Cocaine abuse: reported using cocaine 2 days prior to admission. Counseled on risks of cocaine use - Continue to encourage abstinence  6. Mediastinal lymphadenopathy: preveiously followed with Dr. Arbutus Ped outpatient but was lost to follow-up. 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  - Continue management per primary team  [facility] will sign off.   Medication Recommendations: amlodipine, losartan, and atorvastatin   Other recommendations (labs, testing, etc):  none Follow up as an outpatient:  Will need to establish care with a PCP for ongoing management of HTN/HLD.    For questions or updates, please contact [facility] Please consult [email or web address] for contact  info under Cardiology/STEMI.      Signed, Beatriz Stallion, PA-C  03/14/2018, 7:49 AM   [phone]  Patient examined chart reviewed see my note ok to proceed with GB surgery discussed with Dr Margaree Mackintosh Chest pain non cardiac normal CTA and normal TTE exam with RUQ pain and [place] consistent with cholecystitis  Charlton Haws

## 2018-03-14 NOTE — Transfer of Care (Signed)
Immediate Anesthesia Transfer of Care Note  Patient: Eric Thornton  Procedure(s) Performed: LAPAROSCOPIC CHOLECYSTECTOMY WITH INTRAOPERATIVE CHOLANGIOGRAM (N/A Abdomen)  Patient Location: PACU  Anesthesia Type:General  Level of Consciousness: awake, alert , oriented and patient cooperative  Airway & Oxygen Therapy: Patient Spontanous Breathing and Patient connected to face mask oxygen  Post-op Assessment: Report given to RN and Post -op Vital signs reviewed and stable  Post vital signs: Reviewed and stable  Last Vitals:  Vitals Value Taken Time  BP 142/84 03/14/2018  3:44 PM  Temp    Pulse 93 03/14/2018  3:49 PM  Resp 19 03/14/2018  3:49 PM  SpO2 95 % 03/14/2018  3:49 PM  Vitals shown include unvalidated device data.  Last Pain:  Vitals:   03/14/18 0845  TempSrc:   PainSc: 0-No pain      Patients Stated Pain Goal: 0 (03/14/18 0400)  Complications: No apparent anesthesia complications

## 2018-03-14 NOTE — Anesthesia Preprocedure Evaluation (Addendum)
Anesthesia Evaluation  Patient identified by MRN, date of birth, ID band Patient awake    Reviewed: Allergy & Precautions, NPO status , Patient's Chart, lab work & pertinent test results  Airway Mallampati: II  TM Distance: >3 FB Neck ROM: Full    Dental no notable dental hx. (+) Teeth Intact   Pulmonary neg pulmonary ROS,    Pulmonary exam normal breath sounds clear to auscultation       Cardiovascular hypertension, Pt. on medications Normal cardiovascular exam Rhythm:Regular Rate:Normal     Neuro/Psych negative neurological ROS     GI/Hepatic (+)     substance abuse  cocaine use,   Endo/Other    Renal/GU Renal disease     Musculoskeletal   Abdominal   Peds  Hematology   Anesthesia Other Findings   Reproductive/Obstetrics                            Anesthesia Physical Anesthesia Plan  ASA: III  Anesthesia Plan: General   Post-op Pain Management:    Induction: Intravenous  PONV Risk Score and Plan: 2 and Treatment may vary due to age or medical condition, Ondansetron and Dexamethasone  Airway Management Planned: Oral ETT  Additional Equipment:   Intra-op Plan:   Post-operative Plan: Extubation in OR  Informed Consent: I have reviewed the patients History and Physical, chart, labs and discussed the procedure including the risks, benefits and alternatives for the proposed anesthesia with the patient or authorized representative who has indicated his/her understanding and acceptance.   Dental advisory given  Plan Discussed with: Anesthesiologist and CRNA  Anesthesia Plan Comments:         Anesthesia Quick Evaluation

## 2018-03-14 NOTE — Progress Notes (Signed)
Subjective/Chief Complaint: Patient reports much less abdominal tenderness   Objective: Vital signs in last 24 hours: Temp:  [98.1 F (36.7 C)-98.4 F (36.9 C)] 98.1 F (36.7 C) (10/03 0610) Pulse Rate:  [94-107] 94 (10/03 0610) Resp:  [18] 18 (10/03 0610) BP: (124)/(75-79) 124/75 (10/03 0610) SpO2:  [98 %] 98 % (10/03 0610) Weight:  [72.7 kg] 72.7 kg (10/03 0610) Last BM Date: 03/12/18  Intake/Output from previous day: 10/02 0701 - 10/03 0700 In: 1010.5 [P.O.:120; I.V.:790.5; IV Piggyback:100] Out: 150 [Urine:150] Intake/Output this shift: No intake/output data recorded.  GI: Minimal epigastric tenderness  Lab Results:  Recent Labs    03/13/18 0608 03/14/18 0506  WBC 6.6 5.9  HGB 13.6 13.3  HCT 41.8 40.7  PLT 260 252   BMET Recent Labs    03/13/18 0608 03/14/18 0506  NA 132* 132*  K 3.6 4.0  CL 101 102  CO2 21* 25  GLUCOSE 113* 99  BUN 9 11  CREATININE 1.02 1.23  CALCIUM 8.6* 8.3*   PT/INR Recent Labs    03/12/18 1830 03/14/18 0506  LABPROT 12.2 14.7  INR 0.91 1.16   ABG No results for input(s): PHART, HCO3 in the last 72 hours.  Invalid input(s): PCO2, PO2  Studies/Results: Dg Chest 2 View  Result Date: 03/12/2018 CLINICAL DATA:  Epigastric pain EXAM: CHEST - 2 VIEW COMPARISON:  PET CT 12/16/2012 FINDINGS: No focal opacity or pleural effusion. Normal heart size. Fullness in the hilar regions likely represent nodes, slightly less prominent as compared with prior radiograph. No pneumothorax IMPRESSION: No active cardiopulmonary disease. Bilateral hilar fullness, likely corresponds to lymphadenopathy. This appears slightly decreased as compared with 2014 radiograph. Electronically Signed   By: Jasmine Pang M.D.   On: 03/12/2018 17:51   Nm Hepato W/eject Fract  Result Date: 03/13/2018 CLINICAL DATA:  Gallstones and gallbladder wall thickening. Right upper quadrant pain EXAM: NUCLEAR MEDICINE HEPATOBILIARY IMAGING TECHNIQUE: Sequential images  of the abdomen were obtained out to 60 minutes following intravenous administration of radiopharmaceutical. RADIOPHARMACEUTICALS:  5.0 mCi Tc-32m  Choletec IV COMPARISON:  03/13/2018 FINDINGS: Prompt uptake and biliary excretion of activity by the liver is seen. In the first hour no gallbladder activity visualized. After 90 minutes morphine sulfate was administered intravenously and subsequent imaging was performed without gallbladder visualization. Biliary activity passes into small bowel, consistent with patent common bile duct. IMPRESSION: 1. Nonvisualization of the gallbladder compatible with cystic duct obstruction and acute cholecystitis. 2. Biliary activity passes into the small bowel consistent with patent common bile duct. Electronically Signed   By: Signa Kell M.D.   On: 03/13/2018 17:20   Ct Angio Chest Aorta W/cm &/or Wo/cm  Result Date: 03/13/2018 CLINICAL DATA:  Nausea and midline chest pain today. History of mediastinal and hilar lymphadenopathy. EXAM: CT ANGIOGRAPHY CHEST WITH CONTRAST TECHNIQUE: Multidetector CT imaging of the chest was performed using the standard protocol during bolus administration of intravenous contrast. Multiplanar CT image reconstructions and MIPs were obtained to evaluate the vascular anatomy. CONTRAST:  ISOVUE-370 IOPAMIDOL (ISOVUE-370) INJECTION 76% COMPARISON:  Chest CT 11/22/2012 and PET-CT 12/16/2012 FINDINGS: Cardiovascular: The heart is normal in size. No pericardial effusion. The aorta is normal in caliber. No atherosclerotic calcifications. No dissection. The branch vessels are patent. Scattered coronary artery calcifications are noted. The pulmonary arteries appear normal. No findings to suggest pulmonary embolism. Mediastinum/Nodes: Stable appearing mediastinal and hilar lymphadenopathy since 2014. 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 The esophagus is grossly normal. Lungs/Pleura: No acute pulmonary findings.  No worrisome pulmonary lesions.  Minimal dependent subpleural atelectasis. No findings to suggest interstitial lung disease or bronchiectasis. Upper Abdomen: No significant upper abdominal findings. The upper abdominal aorta is unremarkable and visualized branch vessels appear normal. Chest wall/musculoskeletal: No chest wall mass, supraclavicular or axillary lymphadenopathy. Minimal stable gynecomastia. The thyroid gland appears normal. The bony thorax is intact.  No bone lesions or fracture. Review of the MIP images confirms the above findings. IMPRESSION: 1. Normal CT appearance of the thoracic aorta. No aneurysm or dissection. 2. The pulmonary arteries appear normal. No findings for pulmonary embolism. 3. No acute pulmonary findings. 4. Stable symmetric bulky mediastinal and hilar lymphadenopathy since 2014, most likely due to sarcoidosis. Electronically Signed   By: Rudie Meyer M.D.   On: 03/13/2018 10:01   US Abdomen Limited Ruq  Result Date: 03/13/2018 CLINICAL DATA:  Right upper quadrant pain EXAM: ULTRASOUND ABDOMEN LIMITED RIGHT UPPER QUADRANT COMPARISON:  PET-CT December 16, 2012 FINDINGS: Gallbladder: Within the gallbladder, there are multiple echogenic foci which move and shadow consistent with cholelithiasis. Largest gallstone measures 1.9 cm in length. Gallbladder wall is thickened and edematous. There is no appreciable pericholecystic fluid. No sonographic Murphy sign noted by sonographer. Common bile duct: Diameter: 3 mm. No intrahepatic or extrahepatic biliary duct dilatation. Liver: No focal lesion identified. Within normal limits in parenchymal echogenicity. Portal vein is patent on color Doppler imaging with normal direction of blood flow towards the liver. IMPRESSION: Gallstones with thickened, edematous gallbladder wall. These are findings felt to be indicative of acute cholecystitis. Study otherwise unremarkable. These results will be called to the ordering clinician or representative by the Radiologist Assistant, and  communication documented in the PACS or zVision Dashboard. Electronically Signed   By: Bretta Bang III M.D.   On: 03/13/2018 12:31    Anti-infectives: Anti-infectives (From admission, onward)   Start     Dose/Rate Route Frequency Ordered Stop   03/13/18 1815  cefTRIAXone (ROCEPHIN) 2 g in sodium chloride 0.9 % 100 mL IVPB     2 g 200 mL/hr over 30 Minutes Intravenous Every 24 hours 03/13/18 1810     03/13/18 1730  cefTRIAXone (ROCEPHIN) 1 g in sodium chloride 0.9 % 100 mL IVPB  Status:  Discontinued     1 g 200 mL/hr over 30 Minutes Intravenous Every 24 hours 03/13/18 1724 03/13/18 1809      Assessment/Plan: Acute cholecystitis  Plan laparoscopic cholecystectomy with intraoperative cholangiogram.  Hopefully, this will be later today depending on the OR schedule.  The surgical procedure has been discussed with the patient.  Potential risks, benefits, alternative treatments, and expected outcomes have been explained.  All of the patient's questions at this time have been answered.  The likelihood of reaching the patient's treatment goal is good.  The patient understand the proposed surgical procedure and wishes to proceed.   LOS: 0 days    Eric Thornton 03/14/2018

## 2018-03-14 NOTE — Anesthesia Procedure Notes (Addendum)
Date/Time: 03/14/2018 2:27 PM Performed by: Cleda Daub, CRNA Pre-anesthesia Checklist: Patient identified, Emergency Drugs available, Patient being monitored and Suction available Patient Re-evaluated:Patient Re-evaluated prior to induction Oxygen Delivery Method: Circle system utilized Preoxygenation: Pre-oxygenation with 100% oxygen Induction Type: IV induction Ventilation: Mask ventilation without difficulty and Mask ventilation throughout procedure Laryngoscope Size: Mac and 3 Grade View: Grade I Tube size: 7.5 mm Number of attempts: 1 Airway Equipment and Method: Stylet Placement Confirmation: ETT inserted through vocal cords under direct vision,  positive ETCO2 and breath sounds checked- equal and bilateral Secured at: 24 cm Tube secured with: Tape Dental Injury: Teeth and Oropharynx as per pre-operative assessment

## 2018-03-14 NOTE — Op Note (Signed)
Laparoscopic Cholecystectomy with IOC Procedure Note  Indications: This patient presents with acute calculus cholecystitis and will undergo laparoscopic cholecystectomy.   Pre-operative Diagnosis: Calculus of gallbladder with acute cholecystitis, without mention of obstruction  Post-operative Diagnosis: Same  Surgeon: Wynona Luna   Assistants: none  Anesthesia: General endotracheal anesthesia  ASA Class: 2  Procedure Details  The patient was seen again in the Holding Room. The risks, benefits, complications, treatment options, and expected outcomes were discussed with the patient. The possibilities of reaction to medication, pulmonary aspiration, perforation of viscus, bleeding, recurrent infection, finding a normal gallbladder, the need for additional procedures, failure to diagnose a condition, the possible need to convert to an open procedure, and creating a complication requiring transfusion or operation were discussed with the patient. The likelihood of improving the patient's symptoms with return to their baseline status is good.  The patient and/or family concurred with the proposed plan, giving informed consent. The site of surgery properly noted. The patient was taken to Operating Room, identified as Eric Thornton and the procedure verified as Laparoscopic Cholecystectomy with Intraoperative Cholangiogram. A Time Out was held and the above information confirmed.  Prior to the induction of general anesthesia, antibiotic prophylaxis was administered. General endotracheal anesthesia was then administered and tolerated well. After the induction, the abdomen was prepped with Chloraprep and draped in the sterile fashion. The patient was positioned in the supine position.  Local anesthetic agent was injected into the skin near the umbilicus and an incision made. We dissected down to the abdominal fascia with blunt dissection.  The fascia was incised vertically and we entered the  peritoneal cavity bluntly.  A pursestring suture of 0-Vicryl was placed around the fascial opening.  The Hasson cannula was inserted and secured with the stay suture.  Pneumoperitoneum was then created with CO2 and tolerated well without any adverse changes in the patient's vital signs. An 11-mm port was placed in the subxiphoid position.  Two 5-mm ports were placed in the right upper quadrant. All skin incisions were infiltrated with a local anesthetic agent before making the incision and placing the trocars.   We positioned the patient in reverse Trendelenburg, tilted slightly to the patient's left.  The gallbladder was identified and was quite edematous and thickened. We decompressed the gallbladder with the suction aspirator to allow retraction.The fundus was grasped and retracted cephalad. Adhesions were lysed bluntly and with the electrocautery where indicated, taking care not to injure any adjacent organs or viscus. The infundibulum was grasped and retracted laterally, exposing the peritoneum overlying the triangle of Calot. This was then divided and exposed in a blunt fashion. A critical view of the cystic duct and cystic artery was obtained.  The cystic duct was clearly identified and bluntly dissected circumferentially. The cystic duct was ligated with a clip distally.   An incision was made in the cystic duct and the [facility] cholangiogram catheter introduced. The catheter was secured using a clip. A cholangiogram was then obtained which showed good visualization of the distal and proximal biliary tree with no sign of filling defects or obstruction.  Contrast flowed easily into the duodenum. The catheter was then removed.   The cystic duct was then ligated with clips and divided. The cystic artery was identified, dissected free, ligated with clips and divided as well.   The gallbladder was dissected from the liver bed in retrograde fashion with the electrocautery. The gallbladder was removed and placed  in an Eco sac. The liver bed was  irrigated and inspected. Hemostasis was achieved with the electrocautery. Copious irrigation was utilized and was repeatedly aspirated until clear.  The gallbladder and Eco sac were then removed through the umbilical port site.  The pursestring suture was used to close the umbilical fascia.    We again inspected the right upper quadrant for hemostasis.  Pneumoperitoneum was released as we removed the trocars.  4-0 Monocryl was used to close the skin.   Benzoin, steri-strips, and clean dressings were applied. The patient was then extubated and brought to the recovery room in stable condition. Instrument, sponge, and needle counts were correct at closure and at the conclusion of the case.   Findings: Cholecystitis with Cholelithiasis  Estimated Blood Loss: less than 50 mL         Drains: none         Specimens: Gallbladder           Complications: None; patient tolerated the procedure well.         Disposition: PACU - hemodynamically stable.         Condition: stable  Eric Thornton. Eric Skains, MD, [facility]  General/ Trauma Surgery Beeper [phone]  03/14/2018 3:26 PM

## 2018-03-14 NOTE — Progress Notes (Signed)
Progress Note  Patient Name: Eric Thornton Date of Encounter: 03/14/2018  Primary Cardiologist: None   Subjective   Needs GB surgery this am   Inpatient Medications    Scheduled Meds: . amLODipine  10 mg Oral Daily  . atorvastatin  40 mg Oral q1800  . famotidine  20 mg Oral BID  . losartan  25 mg Oral Daily   Continuous Infusions: . sodium chloride Stopped (03/12/18 1831)  . cefTRIAXone (ROCEPHIN)  IV Stopped (03/13/18 1930)   PRN Meds: acetaminophen **OR** acetaminophen, bismuth subsalicylate, hydrALAZINE, nitroGLYCERIN, ondansetron **OR** ondansetron (ZOFRAN) IV, senna-docusate, zolpidem   Vital Signs    Vitals:   03/12/18 2353 03/13/18 0556 03/13/18 2044 03/14/18 0610  BP: (!) 179/76 (!) 158/92 124/79 124/75  Pulse: (!) 57 98 (!) 107 94  Resp: 20 19 18 18   Temp: 97.8 F (36.6 C) 99 F (37.2 C) 98.4 F (36.9 C) 98.1 F (36.7 C)  TempSrc: Oral Oral Oral Oral  SpO2: 100% 96% 98% 98%  Weight:    72.7 kg  Height:        Intake/Output Summary (Last 24 hours) at 03/14/2018 4098 Last data filed at 03/13/2018 2200 Gross per 24 hour  Intake 220 ml  Output -  Net 220 ml   Filed Weights   03/12/18 1801 03/14/18 0610  Weight: 68 kg 72.7 kg    Telemetry    NSR - Personally Reviewed  ECG    NSR LVH  - Personally Reviewed  Physical Exam  Black male no distress  GEN: No acute distress.   Neck: No JVD Cardiac: RRR, no murmurs, rubs, or gallops.  Respiratory: Clear to auscultation bilaterally. GI: RUQ pain  MS: No edema; No deformity. Neuro:  Nonfocal  Psych: Normal affect   Labs    Chemistry Recent Labs  Lab 03/12/18 1717 03/12/18 1829 03/13/18 0608 03/14/18 0506  NA 139 140 132* 132*  K 4.4 3.5 3.6 4.0  CL 104 103 101 102  CO2 24  --  21* 25  GLUCOSE 98 107* 113* 99  BUN 10 10 9 11   CREATININE 1.37* 1.30* 1.02 1.23  CALCIUM 9.2  --  8.6* 8.3*  PROT 7.9  --   --  7.1  ALBUMIN 3.8  --   --  3.1*  AST 30  --   --  31  ALT 28  --   --   34  ALKPHOS 62  --   --  65  BILITOT 0.6  --   --  0.9  GFRNONAA 57*  --  >60 >60  GFRAA >60  --  >60 >60  ANIONGAP 11  --  10 5     Hematology Recent Labs  Lab 03/12/18 1717 03/12/18 1829 03/13/18 0608 03/14/18 0506  WBC 3.7*  --  6.6 5.9  RBC 4.76  --  4.93 4.78  HGB 13.3 13.3 13.6 13.3  HCT 42.0 39.0 41.8 40.7  MCV 88.2  --  84.8 85.1  MCH 27.9  --  27.6 27.8  MCHC 31.7  --  32.5 32.7  RDW 12.1  --  11.9 11.9  PLT 267  --  260 252    Cardiac Enzymes Recent Labs  Lab 03/12/18 1717 03/13/18 0020 03/13/18 0608 03/13/18 1253  TROPONINI <0.03 <0.03 <0.03 <0.03    Recent Labs  Lab 03/12/18 1827  TROPIPOC 0.01     BNPNo results for input(s): BNP, PROBNP in the last 168 hours.   DDimer No results  for input(s): DDIMER in the last 168 hours.   Radiology    Dg Chest 2 View  Result Date: 03/12/2018 CLINICAL DATA:  Epigastric pain EXAM: CHEST - 2 VIEW COMPARISON:  PET CT 12/16/2012 FINDINGS: No focal opacity or pleural effusion. Normal heart size. Fullness in the hilar regions likely represent nodes, slightly less prominent as compared with prior radiograph. No pneumothorax IMPRESSION: No active cardiopulmonary disease. Bilateral hilar fullness, likely corresponds to lymphadenopathy. This appears slightly decreased as compared with 2014 radiograph. Electronically Signed   By: Jasmine Pang M.D.   On: 03/12/2018 17:51   Nm Hepato W/eject Fract  Result Date: 03/13/2018 CLINICAL DATA:  Gallstones and gallbladder wall thickening. Right upper quadrant pain EXAM: NUCLEAR MEDICINE HEPATOBILIARY IMAGING TECHNIQUE: Sequential images of the abdomen were obtained out to 60 minutes following intravenous administration of radiopharmaceutical. RADIOPHARMACEUTICALS:  5.0 mCi Tc-66m  Choletec IV COMPARISON:  03/13/2018 FINDINGS: Prompt uptake and biliary excretion of activity by the liver is seen. In the first hour no gallbladder activity visualized. After 90 minutes morphine sulfate was  administered intravenously and subsequent imaging was performed without gallbladder visualization. Biliary activity passes into small bowel, consistent with patent common bile duct. IMPRESSION: 1. Nonvisualization of the gallbladder compatible with cystic duct obstruction and acute cholecystitis. 2. Biliary activity passes into the small bowel consistent with patent common bile duct. Electronically Signed   By: Signa Kell M.D.   On: 03/13/2018 17:20   Ct Angio Chest Aorta W/cm &/or Wo/cm  Result Date: 03/13/2018 CLINICAL DATA:  Nausea and midline chest pain today. History of mediastinal and hilar lymphadenopathy. EXAM: CT ANGIOGRAPHY CHEST WITH CONTRAST TECHNIQUE: Multidetector CT imaging of the chest was performed using the standard protocol during bolus administration of intravenous contrast. Multiplanar CT image reconstructions and MIPs were obtained to evaluate the vascular anatomy. CONTRAST:  ISOVUE-370 IOPAMIDOL (ISOVUE-370) INJECTION 76% COMPARISON:  Chest CT 11/22/2012 and PET-CT 12/16/2012 FINDINGS: Cardiovascular: The heart is normal in size. No pericardial effusion. The aorta is normal in caliber. No atherosclerotic calcifications. No dissection. The branch vessels are patent. Scattered coronary artery calcifications are noted. The pulmonary arteries appear normal. No findings to suggest pulmonary embolism. Mediastinum/Nodes: Stable appearing mediastinal and hilar lymphadenopathy since 2014. 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 The esophagus is grossly normal. Lungs/Pleura: No acute pulmonary findings. No worrisome pulmonary lesions. Minimal dependent subpleural atelectasis. No findings to suggest interstitial lung disease or bronchiectasis. Upper Abdomen: No significant upper abdominal findings. The upper abdominal aorta is unremarkable and visualized branch vessels appear normal. Chest wall/musculoskeletal: No chest wall mass, supraclavicular or axillary lymphadenopathy. Minimal  stable gynecomastia. The thyroid gland appears normal. The bony thorax is intact.  No bone lesions or fracture. Review of the MIP images confirms the above findings. IMPRESSION: 1. Normal CT appearance of the thoracic aorta. No aneurysm or dissection. 2. The pulmonary arteries appear normal. No findings for pulmonary embolism. 3. No acute pulmonary findings. 4. Stable symmetric bulky mediastinal and hilar lymphadenopathy since 2014, most likely due to sarcoidosis. Electronically Signed   By: Rudie Meyer M.D.   On: 03/13/2018 10:01   US Abdomen Limited Ruq  Result Date: 03/13/2018 CLINICAL DATA:  Right upper quadrant pain EXAM: ULTRASOUND ABDOMEN LIMITED RIGHT UPPER QUADRANT COMPARISON:  PET-CT December 16, 2012 FINDINGS: Gallbladder: Within the gallbladder, there are multiple echogenic foci which move and shadow consistent with cholelithiasis. Largest gallstone measures 1.9 cm in length. Gallbladder wall is thickened and edematous. There is no appreciable pericholecystic fluid. No sonographic Eulah Pont  sign noted by sonographer. Common bile duct: Diameter: 3 mm. No intrahepatic or extrahepatic biliary duct dilatation. Liver: No focal lesion identified. Within normal limits in parenchymal echogenicity. Portal vein is patent on color Doppler imaging with normal direction of blood flow towards the liver. IMPRESSION: Gallstones with thickened, edematous gallbladder wall. These are findings felt to be indicative of acute cholecystitis. Study otherwise unremarkable. These results will be called to the ordering clinician or representative by the Radiologist Assistant, and communication documented in the PACS or zVision Dashboard. Electronically Signed   By: Bretta Bang III M.D.   On: 03/13/2018 12:31    Cardiac Studies   TTE EF 65% no valve disease   Patient Profile     52 y.o. male with atypical chest pain and HTN in setting of cocaine and cholycystitis  Assessment & Plan    1. Chest Pain:  Non cardiac  TTE normal no RWMA;s CT chest no aneurysm or dissection Needs To stop doing cocain.  Clear to have GB surgery today  2. HTN continue amlodipine   3. GB:  [place] with findings consistent with acute cholecystitis for surgery today clear to have this   [facility] will sign off.   Medication Recommendations:  Norvasc 10 mg  Other recommendations (labs, testing, etc):  Stop cocaine sue  Follow up as an outpatient:  No cardiology f/u needed   For questions or updates, please contact [facility] Please consult [email or web address] for contact info under        Signed, Charlton Haws, MD  03/14/2018, 8:08 AM

## 2018-03-14 NOTE — OR Nursing (Signed)
Care of patient assumed at 1454.

## 2018-03-14 NOTE — Progress Notes (Signed)
[facility] PROGRESS NOTE  JERRETT BALDINGER ZOX:[id number] DOB: [birth year] DOA: 03/12/2018 PCP: Patient, No Pcp Per  Assessment/Plan: Chest pain/epigastric abdominal pain with nausea and vomiting: ACS ruled out. Likely related to acute cholecystitis. HIDA scan positive. Surgery this afternoon -management per general surgery   History of hypertension and hypertensive urgency: hx of non-compliance. Improved control today.  -continue amlodipine and losartan -monitor BP closely -IV hydralazine as needed  Hx ofmediastinal lymphadenopathy: Patient was evaluated by oncologist, Dr. Dala Dock work-up2014 suggested the possibility/probability of a lymphoproliferative disorder such as lymphoma. The patienthas not followed up withoncologist. -Need to follow-up with oncologist as outpatient.  CKD (chronic kidney disease), stage II: at baseline this am -Follow-up renal function by BMP  Cocaine abuse (HCC): -Did counseling about importance of quitting substance abuse - UDS + cocaine   Code Status: full Family Communication: none present Disposition Plan: home when ready   Consultants:  nishan cardilogy  tsuei general surgery  Procedures:  Echo  HIDA  Antibiotics:  none  HPI/Subjective: Reports pain much improve. No questions before surgery  Patient hx htn, cocaine use, CKD 2, mdiastinal lymphadenopathy admitted 10/1 with cc chest pain. Patient admitted that he used crack 2 days prior. EKG showed ST elevation in lead I/aVL. Cardiology, Dr. Katrinka Blazing was consulted, who did not think patient has STEMI. Followed by evaluation Dr Eden Emms who opined non cardiac. Found to have acute cholecystitis. To surgery 10/3  Objective: Vitals:   03/13/18 2044 03/14/18 0610  BP: 124/79 124/75  Pulse: (!) 107 94  Resp: 18 18  Temp: 98.4 F (36.9 C) 98.1 F (36.7 C)  SpO2: 98% 98%    Intake/Output Summary (Last 24 hours) at 03/14/2018 1218 Last data filed at 03/13/2018  2200 Gross per 24 hour  Intake 220 ml  Output -  Net 220 ml   Filed Weights   03/12/18 1801 03/14/18 0610  Weight: 68 kg 72.7 kg    Exam:   General:  Lying in bed awake in no acute distress  Cardiovascular: rrr no mgr no LE edema  Respiratory: normal effort BS clear bilaterally no wheeze  Abdomen: soft +BS diffuse tenderness to palpation particularly RUQ. No guarding or rebounding  Musculoskeletal: joints without swelling/erythema   Data Reviewed: Basic Metabolic Panel: Recent Labs  Lab 03/12/18 1717 03/12/18 1829 03/13/18 0608 03/14/18 0506  NA 139 140 132* 132*  K 4.4 3.5 3.6 4.0  CL 104 103 101 102  CO2 24  --  21* 25  GLUCOSE 98 107* 113* 99  BUN 10 10 9 11   CREATININE 1.37* 1.30* 1.02 1.23  CALCIUM 9.2  --  8.6* 8.3*  MG  --   --   --  1.7   Liver Function Tests: Recent Labs  Lab 03/12/18 1717 03/14/18 0506  AST 30 31  ALT 28 34  ALKPHOS 62 65  BILITOT 0.6 0.9  PROT 7.9 7.1  ALBUMIN 3.8 3.1*   Recent Labs  Lab 03/12/18 1717  LIPASE 45   No results for input(s): AMMONIA in the last 168 hours. CBC: Recent Labs  Lab 03/12/18 1717 03/12/18 1829 03/13/18 0608 03/14/18 0506  WBC 3.7*  --  6.6 5.9  NEUTROABS  --   --   --  4.5  HGB 13.3 13.3 13.6 13.3  HCT 42.0 39.0 41.8 40.7  MCV 88.2  --  84.8 85.1  PLT 267  --  260 252   Cardiac Enzymes: Recent Labs  Lab 03/12/18 1717 03/13/18 0020 03/13/18 0608 03/13/18  1253  TROPONINI <0.03 <0.03 <0.03 <0.03   BNP (last 3 results) No results for input(s): BNP in the last 8760 hours.  ProBNP (last 3 results) No results for input(s): PROBNP in the last 8760 hours.  CBG: No results for input(s): GLUCAP in the last 168 hours.  Recent Results (from the past 240 hour(s))  Surgical pcr screen     Status: Abnormal   Collection Time: 03/14/18  9:55 AM  Result Value Ref Range Status   MRSA, PCR NEGATIVE NEGATIVE Final   Staphylococcus aureus POSITIVE (A) NEGATIVE Final    Comment: (NOTE) The  Xpert SA Assay (FDA approved for NASAL specimens in patients 54 years of age and older), is one component of a comprehensive surveillance program. It is not intended to diagnose infection nor to guide or monitor treatment. Performed at [facility], [address], [place], [place] [address]      Studies: Dg Chest 2 View  Result Date: 03/12/2018 CLINICAL DATA:  Epigastric pain EXAM: CHEST - 2 VIEW COMPARISON:  PET CT 12/16/2012 FINDINGS: No focal opacity or pleural effusion. Normal heart size. Fullness in the hilar regions likely represent nodes, slightly less prominent as compared with prior radiograph. No pneumothorax IMPRESSION: No active cardiopulmonary disease. Bilateral hilar fullness, likely corresponds to lymphadenopathy. This appears slightly decreased as compared with 2014 radiograph. Electronically Signed   By: Jasmine Pang M.D.   On: 03/12/2018 17:51   Nm Hepato W/eject Fract  Result Date: 03/13/2018 CLINICAL DATA:  Gallstones and gallbladder wall thickening. Right upper quadrant pain EXAM: NUCLEAR MEDICINE HEPATOBILIARY IMAGING TECHNIQUE: Sequential images of the abdomen were obtained out to 60 minutes following intravenous administration of radiopharmaceutical. RADIOPHARMACEUTICALS:  5.0 mCi Tc-38m  Choletec IV COMPARISON:  03/13/2018 FINDINGS: Prompt uptake and biliary excretion of activity by the liver is seen. In the first hour no gallbladder activity visualized. After 90 minutes morphine sulfate was administered intravenously and subsequent imaging was performed without gallbladder visualization. Biliary activity passes into small bowel, consistent with patent common bile duct. IMPRESSION: 1. Nonvisualization of the gallbladder compatible with cystic duct obstruction and acute cholecystitis. 2. Biliary activity passes into the small bowel consistent with patent common bile duct. Electronically Signed   By: Signa Kell M.D.   On: 03/13/2018 17:20   Ct Angio Chest Aorta  W/cm &/or Wo/cm  Result Date: 03/13/2018 CLINICAL DATA:  Nausea and midline chest pain today. History of mediastinal and hilar lymphadenopathy. EXAM: CT ANGIOGRAPHY CHEST WITH CONTRAST TECHNIQUE: Multidetector CT imaging of the chest was performed using the standard protocol during bolus administration of intravenous contrast. Multiplanar CT image reconstructions and MIPs were obtained to evaluate the vascular anatomy. CONTRAST:  ISOVUE-370 IOPAMIDOL (ISOVUE-370) INJECTION 76% COMPARISON:  Chest CT 11/22/2012 and PET-CT 12/16/2012 FINDINGS: Cardiovascular: The heart is normal in size. No pericardial effusion. The aorta is normal in caliber. No atherosclerotic calcifications. No dissection. The branch vessels are patent. Scattered coronary artery calcifications are noted. The pulmonary arteries appear normal. No findings to suggest pulmonary embolism. Mediastinum/Nodes: Stable appearing mediastinal and hilar lymphadenopathy since 2014. 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 The esophagus is grossly normal. Lungs/Pleura: No acute pulmonary findings. No worrisome pulmonary lesions. Minimal dependent subpleural atelectasis. No findings to suggest interstitial lung disease or bronchiectasis. Upper Abdomen: No significant upper abdominal findings. The upper abdominal aorta is unremarkable and visualized branch vessels appear normal. Chest wall/musculoskeletal: No chest wall mass, supraclavicular or axillary lymphadenopathy. Minimal stable gynecomastia. The thyroid gland appears normal. The bony  thorax is intact.  No bone lesions or fracture. Review of the MIP images confirms the above findings. IMPRESSION: 1. Normal CT appearance of the thoracic aorta. No aneurysm or dissection. 2. The pulmonary arteries appear normal. No findings for pulmonary embolism. 3. No acute pulmonary findings. 4. Stable symmetric bulky mediastinal and hilar lymphadenopathy since 2014, most likely due to sarcoidosis. Electronically  Signed   By: Rudie Meyer M.D.   On: 03/13/2018 10:01   US Abdomen Limited Ruq  Result Date: 03/13/2018 CLINICAL DATA:  Right upper quadrant pain EXAM: ULTRASOUND ABDOMEN LIMITED RIGHT UPPER QUADRANT COMPARISON:  PET-CT December 16, 2012 FINDINGS: Gallbladder: Within the gallbladder, there are multiple echogenic foci which move and shadow consistent with cholelithiasis. Largest gallstone measures 1.9 cm in length. Gallbladder wall is thickened and edematous. There is no appreciable pericholecystic fluid. No sonographic Murphy sign noted by sonographer. Common bile duct: Diameter: 3 mm. No intrahepatic or extrahepatic biliary duct dilatation. Liver: No focal lesion identified. Within normal limits in parenchymal echogenicity. Portal vein is patent on color Doppler imaging with normal direction of blood flow towards the liver. IMPRESSION: Gallstones with thickened, edematous gallbladder wall. These are findings felt to be indicative of acute cholecystitis. Study otherwise unremarkable. These results will be called to the ordering clinician or representative by the Radiologist Assistant, and communication documented in the PACS or zVision Dashboard. Electronically Signed   By: Bretta Bang III M.D.   On: 03/13/2018 12:31    Scheduled Meds: . amLODipine  10 mg Oral Daily  . atorvastatin  40 mg Oral q1800  . famotidine  20 mg Oral BID  . losartan  25 mg Oral Daily   Continuous Infusions: . sodium chloride Stopped (03/12/18 1831)  . cefTRIAXone (ROCEPHIN)  IV Stopped (03/13/18 1930)    Principal Problem:   Chest pain Active Problems:   Hypertensive urgency   Epigastric abdominal pain   Lymphadenopathy, mediastinal   CKD (chronic kidney disease), stage II   Cocaine abuse (HCC)   Cholelithiasis   Cocaine use   Mediastinal lymphadenopathy    Time spent: 40 minutes    [facility] M  [facility]  If 7PM-7AM, please contact night-coverage at [email or web address], password [email or web address] 03/14/2018,  12:18 PM  LOS: 0 days

## 2018-03-15 ENCOUNTER — Encounter (HOSPITAL_COMMUNITY): Payer: Self-pay | Admitting: Surgery

## 2018-03-15 DIAGNOSIS — K810 Acute cholecystitis: Secondary | ICD-10-CM

## 2018-03-15 LAB — BASIC METABOLIC PANEL
Anion gap: 8 (ref 5–15)
BUN: 15 mg/dL (ref 6–20)
CALCIUM: 8.5 mg/dL — AB (ref 8.9–10.3)
CO2: 22 mmol/L (ref 22–32)
CREATININE: 1.23 mg/dL (ref 0.61–1.24)
Chloride: 104 mmol/L (ref 98–111)
Glucose, Bld: 156 mg/dL — ABNORMAL HIGH (ref 70–99)
Potassium: 4.5 mmol/L (ref 3.5–5.1)
SODIUM: 134 mmol/L — AB (ref 135–145)

## 2018-03-15 LAB — CBC
HCT: 39.2 % (ref 39.0–52.0)
Hemoglobin: 12.8 g/dL — ABNORMAL LOW (ref 13.0–17.0)
MCH: 27.9 pg (ref 26.0–34.0)
MCHC: 32.7 g/dL (ref 30.0–36.0)
MCV: 85.6 fL (ref 78.0–100.0)
PLATELETS: 272 10*3/uL (ref 150–400)
RBC: 4.58 MIL/uL (ref 4.22–5.81)
RDW: 12.1 % (ref 11.5–15.5)
WBC: 6.3 10*3/uL (ref 4.0–10.5)

## 2018-03-15 MED ORDER — ATORVASTATIN CALCIUM 40 MG PO TABS: 40.0000 mg | ORAL_TABLET | Freq: Every day | ORAL | 0 refills | Status: AC

## 2018-03-15 MED ORDER — ACETAMINOPHEN 325 MG PO TABS: 650.0000 mg | ORAL_TABLET | Freq: Four times a day (QID) | ORAL | 0 refills | Status: AC | PRN

## 2018-03-15 MED ORDER — NITROGLYCERIN 0.4 MG SL SUBL: 0.4000 mg | SUBLINGUAL_TABLET | SUBLINGUAL | 0 refills | Status: DC | PRN

## 2018-03-15 MED ORDER — NITROGLYCERIN 0.4 MG SL SUBL: 0.4000 mg | SUBLINGUAL_TABLET | SUBLINGUAL | 0 refills | Status: AC | PRN

## 2018-03-15 MED ORDER — ATORVASTATIN CALCIUM 40 MG PO TABS: 40.0000 mg | ORAL_TABLET | Freq: Every day | ORAL | 0 refills | Status: DC

## 2018-03-15 MED ORDER — LOSARTAN POTASSIUM 25 MG PO TABS: 25.0000 mg | ORAL_TABLET | Freq: Every day | ORAL | 0 refills | Status: AC

## 2018-03-15 MED ORDER — AMLODIPINE BESYLATE 10 MG PO TABS: 10.0000 mg | ORAL_TABLET | Freq: Every day | ORAL | 0 refills | Status: DC

## 2018-03-15 MED ORDER — AMOXICILLIN-POT CLAVULANATE 875-125 MG PO TABS: 1.0000 | ORAL_TABLET | Freq: Two times a day (BID) | ORAL | 0 refills | Status: AC

## 2018-03-15 MED ORDER — ACETAMINOPHEN 325 MG PO TABS: 650.0000 mg | ORAL_TABLET | Freq: Four times a day (QID) | ORAL | 0 refills | Status: DC | PRN

## 2018-03-15 MED ORDER — AMLODIPINE BESYLATE 10 MG PO TABS: 10.0000 mg | ORAL_TABLET | Freq: Every day | ORAL | 0 refills | Status: AC

## 2018-03-15 MED ORDER — AMOXICILLIN-POT CLAVULANATE 875-125 MG PO TABS: 1.0000 | ORAL_TABLET | Freq: Two times a day (BID) | ORAL | 0 refills | Status: DC

## 2018-03-15 MED ORDER — LOSARTAN POTASSIUM 25 MG PO TABS: 25.0000 mg | ORAL_TABLET | Freq: Every day | ORAL | 0 refills | Status: DC

## 2018-03-15 MED ORDER — OXYCODONE HCL 5 MG PO TABS: 5.0000 mg | ORAL_TABLET | Freq: Four times a day (QID) | ORAL | 0 refills | Status: DC | PRN

## 2018-03-15 MED FILL — AMOX-CLAV 875-125 MG TABLET: 875-125 | 3 days supply | Qty: 6 | Fill #0

## 2018-03-15 MED FILL — NITROSTAT 0.4 MG TABLET SL: 0.4 | 25 days supply | Qty: 25 | Fill #0

## 2018-03-15 MED FILL — LOSARTAN POTASSIUM 25 MG TA: 25 | 30 days supply | Qty: 30 | Fill #0

## 2018-03-15 MED FILL — AMLODIPINE BESYLATE 10 MG T: 10 | 30 days supply | Qty: 30 | Fill #0

## 2018-03-15 MED FILL — ATORVASTATIN CALCIUM 40 MG: 40 | 30 days supply | Qty: 30 | Fill #0

## 2018-03-15 NOTE — Progress Notes (Addendum)
Progress Note  Patient Name: Eric Thornton Date of Encounter: 03/15/2018  Primary Cardiologist: New  Subjective   Post cholecystectomy no cardiac issues   Inpatient Medications    Scheduled Meds: . amLODipine  10 mg Oral Daily  . atorvastatin  40 mg Oral q1800  . Chlorhexidine Gluconate Cloth  6 each Topical Daily  . famotidine  20 mg Oral BID  . losartan  25 mg Oral Daily  . mupirocin ointment  1 application Nasal BID   Continuous Infusions: . sodium chloride Stopped (03/12/18 1831)  . cefTRIAXone (ROCEPHIN)  IV Stopped (03/14/18 1742)  . lactated ringers Stopped (03/14/18 1900)   PRN Meds: acetaminophen, bismuth subsalicylate, hydrALAZINE, morphine injection, nitroGLYCERIN, ondansetron **OR** ondansetron (ZOFRAN) IV, oxyCODONE, senna-docusate, zolpidem   Vital Signs    Vitals:   03/14/18 2110 03/15/18 0104 03/15/18 0611 03/15/18 0611  BP: 130/84 130/70  126/80  Pulse: 87 70  80  Resp:      Temp: (!) 97.5 F (36.4 C) (!) 97.5 F (36.4 C)  (!) 97.5 F (36.4 C)  TempSrc: Oral Oral  Oral  SpO2: 98% 99%  97%  Weight:   72.2 kg   Height:        Intake/Output Summary (Last 24 hours) at 03/15/2018 0851 Last data filed at 03/15/2018 0611 Gross per 24 hour  Intake 1517.61 ml  Output 1330 ml  Net 187.61 ml   Filed Weights   03/12/18 1801 03/14/18 0610 03/15/18 0611  Weight: 68 kg 72.7 kg 72.2 kg    Telemetry    Sinus rhythm with sinus tachycardia - Personally Reviewed  Physical Exam   GEN: laying in bed in no acute distress.   Neck: No JVD, no carotid bruits Cardiac: RRR, no murmurs, rubs, or gallops.  Respiratory: Clear to auscultation bilaterally, no wheezes/ rales/ rhonchi GI: post choly BS positive  MS: No edema; No deformity. Neuro:  Nonfocal, moving all extremities spontaneously Psych: Normal affect   Labs    Chemistry Recent Labs  Lab 03/12/18 1717  03/13/18 0608 03/14/18 0506 03/15/18 0511  NA 139   < > 132* 132* 134*  K 4.4   < >  3.6 4.0 4.5  CL 104   < > 101 102 104  CO2 24  --  21* 25 22  GLUCOSE 98   < > 113* 99 156*  BUN 10   < > 9 11 15   CREATININE 1.37*   < > 1.02 1.23 1.23  CALCIUM 9.2  --  8.6* 8.3* 8.5*  PROT 7.9  --   --  7.1  --   ALBUMIN 3.8  --   --  3.1*  --   AST 30  --   --  31  --   ALT 28  --   --  34  --   ALKPHOS 62  --   --  65  --   BILITOT 0.6  --   --  0.9  --   GFRNONAA 57*  --  >60 >60 >60  GFRAA >60  --  >60 >60 >60  ANIONGAP 11  --  10 5 8    < > = values in this interval not displayed.     Hematology Recent Labs  Lab 03/13/18 0608 03/14/18 0506 03/15/18 0511  WBC 6.6 5.9 6.3  RBC 4.93 4.78 4.58  HGB 13.6 13.3 12.8*  HCT 41.8 40.7 39.2  MCV 84.8 85.1 85.6  MCH 27.6 27.8 27.9  MCHC 32.5 32.7  32.7  RDW 11.9 11.9 12.1  PLT 260 252 272    Cardiac Enzymes Recent Labs  Lab 03/12/18 1717 03/13/18 0020 03/13/18 0608 03/13/18 1253  TROPONINI <0.03 <0.03 <0.03 <0.03    Recent Labs  Lab 03/12/18 1827  TROPIPOC 0.01     BNPNo results for input(s): BNP, PROBNP in the last 168 hours.   DDimer No results for input(s): DDIMER in the last 168 hours.   Radiology    Dg Cholangiogram Operative  Result Date: 03/14/2018 CLINICAL DATA:  Intraoperative cholangiogram during laparoscopic cholecystectomy. EXAM: INTRAOPERATIVE CHOLANGIOGRAM FLUOROSCOPY TIME:  7 seconds COMPARISON:  Nuclear medicine HIDA scan-03/13/2018; right upper quadrant abdominal ultrasound-03/13/2018 FINDINGS: A single spot intra op cholangiographic image of the right upper abdominal quadrant during laparoscopic cholecystectomy are provided for review. Surgical clips overlie the expected location of the gallbladder fossa. Contrast injection demonstrates selective cannulation of the central aspect of the cystic duct. There is passage of contrast through the central aspect of the cystic duct with filling of a non dilated common bile duct. There is passage of contrast though the CBD and into the descending portion  of the duodenum. There is minimal reflux of injected contrast into the common hepatic duct and central aspect of the non dilated intrahepatic biliary system. There are no discrete filling defects within the opacified portions of the biliary system to suggest the presence of choledocholithiasis. IMPRESSION: No evidence of choledocholithiasis. Electronically Signed   By: Simonne Come M.D.   On: 03/14/2018 16:10   Nm Hepato W/eject Fract  Result Date: 03/13/2018 CLINICAL DATA:  Gallstones and gallbladder wall thickening. Right upper quadrant pain EXAM: NUCLEAR MEDICINE HEPATOBILIARY IMAGING TECHNIQUE: Sequential images of the abdomen were obtained out to 60 minutes following intravenous administration of radiopharmaceutical. RADIOPHARMACEUTICALS:  5.0 mCi Tc-89m  Choletec IV COMPARISON:  03/13/2018 FINDINGS: Prompt uptake and biliary excretion of activity by the liver is seen. In the first hour no gallbladder activity visualized. After 90 minutes morphine sulfate was administered intravenously and subsequent imaging was performed without gallbladder visualization. Biliary activity passes into small bowel, consistent with patent common bile duct. IMPRESSION: 1. Nonvisualization of the gallbladder compatible with cystic duct obstruction and acute cholecystitis. 2. Biliary activity passes into the small bowel consistent with patent common bile duct. Electronically Signed   By: Signa Kell M.D.   On: 03/13/2018 17:20   Ct Angio Chest Aorta W/cm &/or Wo/cm  Result Date: 03/13/2018 CLINICAL DATA:  Nausea and midline chest pain today. History of mediastinal and hilar lymphadenopathy. EXAM: CT ANGIOGRAPHY CHEST WITH CONTRAST TECHNIQUE: Multidetector CT imaging of the chest was performed using the standard protocol during bolus administration of intravenous contrast. Multiplanar CT image reconstructions and MIPs were obtained to evaluate the vascular anatomy. CONTRAST:  ISOVUE-370 IOPAMIDOL (ISOVUE-370)  INJECTION 76% COMPARISON:  Chest CT 11/22/2012 and PET-CT 12/16/2012 FINDINGS: Cardiovascular: The heart is normal in size. No pericardial effusion. The aorta is normal in caliber. No atherosclerotic calcifications. No dissection. The branch vessels are patent. Scattered coronary artery calcifications are noted. The pulmonary arteries appear normal. No findings to suggest pulmonary embolism. Mediastinum/Nodes: Stable appearing mediastinal and hilar lymphadenopathy since 2014. 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 The esophagus is grossly normal. Lungs/Pleura: No acute pulmonary findings. No worrisome pulmonary lesions. Minimal dependent subpleural atelectasis. No findings to suggest interstitial lung disease or bronchiectasis. Upper Abdomen: No significant upper abdominal findings. The upper abdominal aorta is unremarkable and visualized branch vessels appear normal. Chest wall/musculoskeletal: No chest wall mass, supraclavicular or  axillary lymphadenopathy. Minimal stable gynecomastia. The thyroid gland appears normal. The bony thorax is intact.  No bone lesions or fracture. Review of the MIP images confirms the above findings. IMPRESSION: 1. Normal CT appearance of the thoracic aorta. No aneurysm or dissection. 2. The pulmonary arteries appear normal. No findings for pulmonary embolism. 3. No acute pulmonary findings. 4. Stable symmetric bulky mediastinal and hilar lymphadenopathy since 2014, most likely due to sarcoidosis. Electronically Signed   By: Rudie Meyer M.D.   On: 03/13/2018 10:01   US Abdomen Limited Ruq  Result Date: 03/13/2018 CLINICAL DATA:  Right upper quadrant pain EXAM: ULTRASOUND ABDOMEN LIMITED RIGHT UPPER QUADRANT COMPARISON:  PET-CT December 16, 2012 FINDINGS: Gallbladder: Within the gallbladder, there are multiple echogenic foci which move and shadow consistent with cholelithiasis. Largest gallstone measures 1.9 cm in length. Gallbladder wall is thickened and edematous. There is no  appreciable pericholecystic fluid. No sonographic Murphy sign noted by sonographer. Common bile duct: Diameter: 3 mm. No intrahepatic or extrahepatic biliary duct dilatation. Liver: No focal lesion identified. Within normal limits in parenchymal echogenicity. Portal vein is patent on color Doppler imaging with normal direction of blood flow towards the liver. IMPRESSION: Gallstones with thickened, edematous gallbladder wall. These are findings felt to be indicative of acute cholecystitis. Study otherwise unremarkable. These results will be called to the ordering clinician or representative by the Radiologist Assistant, and communication documented in the PACS or zVision Dashboard. Electronically Signed   By: Bretta Bang III M.D.   On: 03/13/2018 12:31    Cardiac Studies    Echocardiogram 03/13/18: Study Conclusions  - Left ventricle: The cavity size was normal. Wall thickness was   normal. Systolic function was normal. The estimated ejection   fraction was in the range of 55% to 60%. Wall motion was normal;   there were no regional wall motion abnormalities. Doppler   parameters are consistent with abnormal left ventricular   relaxation (grade 1 diastolic dysfunction). - Aortic valve: There was no stenosis. - Mitral valve: There was trivial regurgitation. - Right ventricle: The cavity size was normal. Systolic function   was normal. - Atrial septum: Mobile septum, cannot rule out small PFO. - Pulmonary arteries: No complete TR doppler jet so unable to   estimate PA systolic pressure. - Inferior vena cava: The vessel was normal in size. The   respirophasic diameter changes were in the normal range (>= 50%),   consistent with normal central venous pressure.  Impressions:  - Normal LV size with EF 55-60%. Normal RV size and systolic   function. No significant valvular abnormalities.  Patient Profile     51 y.o. male with a PMH of HTN, cocaine abuse, mediastinal lymphadenopathy  (lost to follow-up with Dr. Arbutus Ped), who presented with chest pain for which cardiology is following  Assessment & Plan    1. Atypical chest pain: patient presented with lower chest/epigastric pain in the setting of hypertensive urgency, recent cocaine use, and N/V 2/2 cholelithiasis. CTA chest without aortic dissection/PE. Echo yesterday revealed EF 55-60%, G1DD, no significant valvular abnormalities. Troponins were negative and EKG with LVH with strain pattern. RUQ [place] yesterday revealed cholelithiasis and gallbladder wall thickening for which surgery is recommending CCY. - No further cardiac work-up at this time.   2. RUQ/epigastric pain: Day one post cholecystectomy stable   3. Hypertensive urgency: BP much improved    - Continue amlodipine and losartan  4. HLD: LDL 131 this admission - Continue atorvastatin  5. Cocaine abuse: reported  using cocaine 2 days prior to admission. Counseled on risks of cocaine use - Continue to encourage abstinence  6. Mediastinal lymphadenopathy: preveiously followed with Dr. Arbutus Ped outpatient but was lost to follow-up. 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  - Continue management per primary team  [facility] will sign off.   Medication Recommendations: amlodipine, losartan, and atorvastatin   Other recommendations (labs, testing, etc):  none Follow up as an outpatient:  Will need to establish care with a PCP for ongoing management of HTN/HLD.    For questions or updates, please contact [facility] Please consult [email or web address] for contact info under Cardiology/STEMI.   Charlton Haws

## 2018-03-15 NOTE — Progress Notes (Signed)
Discharge teaching complete. Meds, diet, activity, follow up appointments and incision care reviewed and all questions answered. Copy of instructions given to patient and prescriptions sent to pharmacy.  

## 2018-03-15 NOTE — Discharge Summary (Signed)
Discharge Summary  Eric Thornton VQQ:[id number] DOB: [birth year]  PCP: Patient, No Pcp Per  Admit date: 03/12/2018 Discharge date: 03/15/2018  Time spent: , more than 50% time spent on coordination of care  Recommendations for Outpatient Follow-up:  1. F/u with PMD within a week  for hospital discharge follow up, repeat cbc/bmp at follow up ([facility] on 10/29) 2. F/u with general surgery for post op follow up 3. F/u with oncology Dr Sofie Hartigan for mediastinal lymphadenopathy  Discharge Diagnoses:  Active Hospital Problems   Diagnosis Date Noted  . Chest pain 03/12/2018  . Epigastric abdominal pain 03/13/2018  . Cholelithiasis 03/13/2018  . Cocaine use   . Mediastinal lymphadenopathy   . 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 Hypertensive urgency 03/12/2018  . Cocaine abuse (HCC) 03/12/2018  . Lymphadenopathy, mediastinal 12/03/2012    Resolved Hospital Problems  No resolved problems to display.    Discharge Condition: stable  Diet recommendation: heart healthy  Filed Weights   03/12/18 1801 03/14/18 0610 03/15/18 0611  Weight: 68 kg 72.7 kg 72.2 kg    History of present illness: (per admitting MD Dr Clyde Lundborg) PCP: Patient, No Pcp Per   Patient coming from:  The patient is coming from home.  At baseline, pt is independent for most of ADL.   Chief Complaint: Chest pain  HPI: Eric Thornton is a 52 y.o. male with medical history significant of hypertension, cocaine abuse, CKD 2, mediastinal lymphadenopathy, who presents with chest pain  Patient states that his chest pain started at about 1 PM.  It is located in the lower chest, 10 out of 10 severity, radiating down to epigastric area.  Patient denies cough, shortness of breath.  No recent long distant traveling or tenderness the cough varies.  Patient has nausea and vomited several times with nonbloody, non-biliary vomitus.  Denies diarrhea.  No symptoms of UTI or  unilateral weakness.  Patient admitted that he used crack 2 days ago.  EKG showed ST elevation in lead I/aVL.  Cardiology, Dr. Katrinka Blazing was consulted, who did not think patient has STEMI.   ED Course: pt was found to have negative troponin, WBC 3.7, INR 0.1, PTT 30, lipase 45, normal liver function, slightly worsening renal function, temperature normal, bradycardia, tachypnea, oxygen saturation 98% on room air.  Chest x-ray showed bilateral hilar fullness corresponding to lymphadenopathy, otherwise no infiltration.  Patient is placed on telemetry bed for observation.  Hospital Course:  Principal Problem:   Chest pain Active Problems:   Lymphadenopathy, mediastinal   CKD (chronic kidney disease), stage II   Hypertensive urgency   Cocaine abuse (HCC)   Epigastric abdominal pain   Cholelithiasis   Cocaine use   Mediastinal lymphadenopathy  Chest pain/epigastric abdominal pain with nausea and vomiting: ACS ruled out.  HIDA scan positive. + acute cholecystitis. He received rocephin in the hospital, he is discharged on augmentin for another three days S/p laparoscopic cholecystectomy by general surgery Dr Corliss Skains -he is cleared to d/c home with outpatient general surgery follow up on 10/17  History of hypertension and hypertensive urgency: -hx of non-compliance. with chronic cocaine use ( report more than 29yrs)  -he is discharged on amlodipine and losartan  Hx ofmediastinal lymphadenopathy: Patient was evaluated by oncologist, Dr. Dala Dock work-up2014 suggested PET scan and biopsy to r/o lymphoproliferative disorder such as lymphoma. The patienthas not followed up withoncologist. -Need to follow-up with oncologist as outpatient.  CKD (chronic kidney disease), stage II:at baseline  this am  Cocaine abuse (HCC): -Did counseling about importance of quitting substance abuse -UDS + cocaine   Code Status: full Family Communication: none present Disposition Plan: home     Consultants:  nishan cardilogy  tsuei general surgery  Procedures:  Echo  HIDA  Antibiotics:  rocephin   Discharge Exam: BP 130/83 (BP Location: Right Arm)   Pulse 88   Temp 98.4 F (36.9 C) (Oral)   Resp 20   Ht 5\' 6"  (1.676 m)   Wt 72.2 kg   SpO2 100%   BMI 25.68 kg/m   General: NAD Cardiovascular: RRR Respiratory: cTABL  Discharge Instructions You were cared for by a hospitalist during your hospital stay. If you have any questions about your discharge medications or the care you received while you were in the hospital after you are discharged, you can call the unit and asked to speak with the hospitalist on call if the hospitalist that took care of you is not available. Once you are discharged, your primary care physician will handle any further medical issues. Please note that NO REFILLS for any discharge medications will be authorized once you are discharged, as it is imperative that you return to your primary care physician (or establish a relationship with a primary care physician if you do not have one) for your aftercare needs so that they can reassess your need for medications and monitor your lab values.  Discharge Instructions    Diet general   Complete by:  As directed    Increase activity slowly   Complete by:  As directed      Allergies as of 03/15/2018   No Known Allergies     Medication List    STOP taking these medications   naproxen 500 MG tablet Commonly known as:  NAPROSYN     TAKE these medications   acetaminophen 325 MG tablet Commonly known as:  TYLENOL Take 2 tablets (650 mg total) by mouth every 6 (six) hours as needed for mild pain.   amLODipine 10 MG tablet Commonly known as:  NORVASC Take 1 tablet (10 mg total) by mouth daily. Start taking on:  03/16/2018   amoxicillin-clavulanate 875-125 MG tablet Commonly known as:  AUGMENTIN Take 1 tablet by mouth 2 (two) times daily for 3 days.   atorvastatin 40 MG  tablet Commonly known as:  LIPITOR Take 1 tablet (40 mg total) by mouth daily at 6 PM.   bismuth subsalicylate 262 MG/15ML suspension Commonly known as:  PEPTO BISMOL Take 30 mLs by mouth every 6 (six) hours as needed for indigestion.   losartan 25 MG tablet Commonly known as:  COZAAR Take 1 tablet (25 mg total) by mouth daily. Start taking on:  03/16/2018   nitroGLYCERIN 0.4 MG SL tablet Commonly known as:  NITROSTAT Place 1 tablet (0.4 mg total) under the tongue every 5 (five) minutes as needed for chest pain.   oxyCODONE 5 MG immediate release tablet Commonly known as:  Oxy IR/ROXICODONE Take 1 tablet (5 mg total) by mouth every 6 (six) hours as needed for severe pain.      No Known Allergies Follow-up Information    [facility], [place]. Go on 03/28/2018.   Specialty:  General Surgery Why:  Your appointment is 10/17 at 2:45 pm Please arrive 30 minutes prior to your appointment to check in and fill out paperwork. Bring photo ID and insurance information. Contact information: [address] [place] [address] [phone]  Si Gaul, MD Follow up.   Specialty:  Oncology Why:  mediastinal lymphadenopathy Contact information: [address] [place] [address] [phone]        [facility]. Go on 04/09/2018.   Why:  @ 2:50 for hospital follow up appointment with Jonah Blue. Pt will be able to utilize the onsite pharmacy for medications- cost ranging from $4.00-$10.00 Contact information: [address] [place] [facility] [id number] [phone]           The results of significant diagnostics from this hospitalization (including imaging, microbiology, ancillary and laboratory) are listed below for reference.    Significant Diagnostic Studies: Dg Chest 2 View  Result Date: 03/12/2018 CLINICAL DATA:  Epigastric pain EXAM: CHEST - 2 VIEW  COMPARISON:  PET CT 12/16/2012 FINDINGS: No focal opacity or pleural effusion. Normal heart size. Fullness in the hilar regions likely represent nodes, slightly less prominent as compared with prior radiograph. No pneumothorax IMPRESSION: No active cardiopulmonary disease. Bilateral hilar fullness, likely corresponds to lymphadenopathy. This appears slightly decreased as compared with 2014 radiograph. Electronically Signed   By: Jasmine Pang M.D.   On: 03/12/2018 17:51   Dg Cholangiogram Operative  Result Date: 03/14/2018 CLINICAL DATA:  Intraoperative cholangiogram during laparoscopic cholecystectomy. EXAM: INTRAOPERATIVE CHOLANGIOGRAM FLUOROSCOPY TIME:  7 seconds COMPARISON:  Nuclear medicine HIDA scan-03/13/2018; right upper quadrant abdominal ultrasound-03/13/2018 FINDINGS: A single spot intra op cholangiographic image of the right upper abdominal quadrant during laparoscopic cholecystectomy are provided for review. Surgical clips overlie the expected location of the gallbladder fossa. Contrast injection demonstrates selective cannulation of the central aspect of the cystic duct. There is passage of contrast through the central aspect of the cystic duct with filling of a non dilated common bile duct. There is passage of contrast though the CBD and into the descending portion of the duodenum. There is minimal reflux of injected contrast into the common hepatic duct and central aspect of the non dilated intrahepatic biliary system. There are no discrete filling defects within the opacified portions of the biliary system to suggest the presence of choledocholithiasis. IMPRESSION: No evidence of choledocholithiasis. Electronically Signed   By: Simonne Come M.D.   On: 03/14/2018 16:10   Nm Hepato W/eject Fract  Result Date: 03/13/2018 CLINICAL DATA:  Gallstones and gallbladder wall thickening. Right upper quadrant pain EXAM: NUCLEAR MEDICINE HEPATOBILIARY IMAGING TECHNIQUE: Sequential images of the abdomen  were obtained out to 60 minutes following intravenous administration of radiopharmaceutical. RADIOPHARMACEUTICALS:  5.0 mCi Tc-6m  Choletec IV COMPARISON:  03/13/2018 FINDINGS: Prompt uptake and biliary excretion of activity by the liver is seen. In the first hour no gallbladder activity visualized. After 90 minutes morphine sulfate was administered intravenously and subsequent imaging was performed without gallbladder visualization. Biliary activity passes into small bowel, consistent with patent common bile duct. IMPRESSION: 1. Nonvisualization of the gallbladder compatible with cystic duct obstruction and acute cholecystitis. 2. Biliary activity passes into the small bowel consistent with patent common bile duct. Electronically Signed   By: Signa Kell M.D.   On: 03/13/2018 17:20   Ct Angio Chest Aorta W/cm &/or Wo/cm  Result Date: 03/13/2018 CLINICAL DATA:  Nausea and midline chest pain today. History of mediastinal and hilar lymphadenopathy. EXAM: CT ANGIOGRAPHY CHEST WITH CONTRAST TECHNIQUE: Multidetector CT imaging of the chest was performed using the standard protocol during bolus administration of intravenous contrast. Multiplanar CT image reconstructions and MIPs were obtained to evaluate the vascular anatomy. CONTRAST:  ISOVUE-370 IOPAMIDOL (ISOVUE-370)  INJECTION 76% COMPARISON:  Chest CT 11/22/2012 and PET-CT 12/16/2012 FINDINGS: Cardiovascular: The heart is normal in size. No pericardial effusion. The aorta is normal in caliber. No atherosclerotic calcifications. No dissection. The branch vessels are patent. Scattered coronary artery calcifications are noted. The pulmonary arteries appear normal. No findings to suggest pulmonary embolism. Mediastinum/Nodes: Stable appearing mediastinal and hilar lymphadenopathy since 2014. 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 The esophagus is grossly normal. Lungs/Pleura: No acute pulmonary findings. No worrisome pulmonary lesions. Minimal  dependent subpleural atelectasis. No findings to suggest interstitial lung disease or bronchiectasis. Upper Abdomen: No significant upper abdominal findings. The upper abdominal aorta is unremarkable and visualized branch vessels appear normal. Chest wall/musculoskeletal: No chest wall mass, supraclavicular or axillary lymphadenopathy. Minimal stable gynecomastia. The thyroid gland appears normal. The bony thorax is intact.  No bone lesions or fracture. Review of the MIP images confirms the above findings. IMPRESSION: 1. Normal CT appearance of the thoracic aorta. No aneurysm or dissection. 2. The pulmonary arteries appear normal. No findings for pulmonary embolism. 3. No acute pulmonary findings. 4. Stable symmetric bulky mediastinal and hilar lymphadenopathy since 2014, most likely due to sarcoidosis. Electronically Signed   By: Rudie Meyer M.D.   On: 03/13/2018 10:01   US Abdomen Limited Ruq  Result Date: 03/13/2018 CLINICAL DATA:  Right upper quadrant pain EXAM: ULTRASOUND ABDOMEN LIMITED RIGHT UPPER QUADRANT COMPARISON:  PET-CT December 16, 2012 FINDINGS: Gallbladder: Within the gallbladder, there are multiple echogenic foci which move and shadow consistent with cholelithiasis. Largest gallstone measures 1.9 cm in length. Gallbladder wall is thickened and edematous. There is no appreciable pericholecystic fluid. No sonographic Murphy sign noted by sonographer. Common bile duct: Diameter: 3 mm. No intrahepatic or extrahepatic biliary duct dilatation. Liver: No focal lesion identified. Within normal limits in parenchymal echogenicity. Portal vein is patent on color Doppler imaging with normal direction of blood flow towards the liver. IMPRESSION: Gallstones with thickened, edematous gallbladder wall. These are findings felt to be indicative of acute cholecystitis. Study otherwise unremarkable. These results will be called to the ordering clinician or representative by the Radiologist Assistant, and communication  documented in the PACS or zVision Dashboard. Electronically Signed   By: Bretta Bang III M.D.   On: 03/13/2018 12:31    Microbiology: Recent Results (from the past 240 hour(s))  Surgical pcr screen     Status: Abnormal   Collection Time: 03/14/18  9:55 AM  Result Value Ref Range Status   MRSA, PCR NEGATIVE NEGATIVE Final   Staphylococcus aureus POSITIVE (A) NEGATIVE Final    Comment: (NOTE) The Xpert SA Assay (FDA approved for NASAL specimens in patients 75 years of age and older), is one component of a comprehensive surveillance program. It is not intended to diagnose infection nor to guide or monitor treatment. Performed at [facility], [address] [place], [place] [address]      Labs: Basic Metabolic Panel: Recent Labs  Lab 03/12/18 1717 03/12/18 1829 03/13/18 0608 03/14/18 0506 03/15/18 0511  NA 139 140 132* 132* 134*  K 4.4 3.5 3.6 4.0 4.5  CL 104 103 101 102 104  CO2 24  --  21* 25 22  GLUCOSE 98 107* 113* 99 156*  BUN 10 10 9 11 15   CREATININE 1.37* 1.30* 1.02 1.23 1.23  CALCIUM 9.2  --  8.6* 8.3* 8.5*  MG  --   --   --  1.7  --    Liver Function Tests: Recent Labs  Lab 03/12/18  1717 03/14/18 0506  AST 30 31  ALT 28 34  ALKPHOS 62 65  BILITOT 0.6 0.9  PROT 7.9 7.1  ALBUMIN 3.8 3.1*   Recent Labs  Lab 03/12/18 1717  LIPASE 45   No results for input(s): AMMONIA in the last 168 hours. CBC: Recent Labs  Lab 03/12/18 1717 03/12/18 1829 03/13/18 0608 03/14/18 0506 03/15/18 0511  WBC 3.7*  --  6.6 5.9 6.3  NEUTROABS  --   --   --  4.5  --   HGB 13.3 13.3 13.6 13.3 12.8*  HCT 42.0 39.0 41.8 40.7 39.2  MCV 88.2  --  84.8 85.1 85.6  PLT 267  --  260 252 272   Cardiac Enzymes: Recent Labs  Lab 03/12/18 1717 03/13/18 0020 03/13/18 0608 03/13/18 1253  TROPONINI <0.03 <0.03 <0.03 <0.03   BNP: BNP (last 3 results) No results for input(s): BNP in the last 8760 hours.  ProBNP (last 3 results) No results for input(s): PROBNP  in the last 8760 hours.  CBG: No results for input(s): GLUCAP in the last 168 hours.     Signed:  Albertine Grates MD, PhD  [facility] 03/15/2018, 7:29 PM

## 2018-03-15 NOTE — Care Management Note (Signed)
Case Management Note  Patient Details  Name: Eric Thornton MRN: [id number] Date of Birth: [birth year]  Subjective/Objective:  Pt presented for Chest Pain- Pt is without PCP. Pt states he works part-time and will be able to afford medications. Rx's will be sent to [facility]- Pt has transportation to get to appointments and pharmacy.                  Action/Plan: Hospital Follow up appointment scheduled. No further needs from CM at this time.   Expected Discharge Date:  03/15/18               Expected Discharge Plan:  Home/Self Care  In-House Referral:  PCP / [facility]  Discharge planning Services  CM Consult, Follow-up appt scheduled, [facility], Medication Assistance  Post Acute Care Choice:  NA Choice offered to:  NA  DME Arranged:  N/A DME Agency:  NA  HH Arranged:  NA HH Agency:  NA  Status of Service:  Completed, signed off  If discussed at Long Length of Stay Meetings, dates discussed:    Additional Comments:  Gala Lewandowsky, RN 03/15/2018, 11:10 AM

## 2018-03-15 NOTE — Anesthesia Postprocedure Evaluation (Signed)
Anesthesia Post Note  Patient: Eric Thornton  Procedure(s) Performed: LAPAROSCOPIC CHOLECYSTECTOMY WITH INTRAOPERATIVE CHOLANGIOGRAM (N/A Abdomen)     Patient location during evaluation: PACU Anesthesia Type: General Level of consciousness: awake and alert Pain management: pain level controlled Vital Signs Assessment: post-procedure vital signs reviewed and stable Respiratory status: spontaneous breathing, nonlabored ventilation, respiratory function stable and patient connected to nasal cannula oxygen Cardiovascular status: blood pressure returned to baseline and stable Postop Assessment: no apparent nausea or vomiting Anesthetic complications: no    Last Vitals:  Vitals:   03/15/18 0104 03/15/18 0611  BP: 130/70 126/80  Pulse: 70 80  Resp:    Temp: (!) 36.4 C (!) 36.4 C  SpO2: 99% 97%    Last Pain:  Vitals:   03/15/18 0611  TempSrc: Oral  PainSc:                  [name] S

## 2018-03-15 NOTE — Progress Notes (Signed)
1 Day Post-Op   Subjective/Chief Complaint: Patient having some soreness from incisions No nausea - only clears so far   Objective: Vital signs in last 24 hours: Temp:  [97.5 F (36.4 C)-97.9 F (36.6 C)] 97.5 F (36.4 C) (10/04 9562) Pulse Rate:  [70-96] 80 (10/04 0611) Resp:  [20-24] 20 (10/03 1615) BP: (120-150)/(67-85) 126/80 (10/04 0611) SpO2:  [96 %-99 %] 97 % (10/04 0611) Weight:  [72.2 kg] 72.2 kg (10/04 0611) Last BM Date: 03/15/18  Intake/Output from previous day: 10/03 0701 - 10/04 0700 In: 1517.6 [P.O.:480; I.V.:933.3; IV Piggyback:104.3] Out: 1330 [Urine:1325; Blood:5] Intake/Output this shift: No intake/output data recorded.  General appearance: alert, cooperative and no distress GI: soft, incisional tenderness Minimal drainage at umbilicus; other dressings dry  Lab Results:  Recent Labs    03/14/18 0506 03/15/18 0511  WBC 5.9 6.3  HGB 13.3 12.8*  HCT 40.7 39.2  PLT 252 272   BMET Recent Labs    03/14/18 0506 03/15/18 0511  NA 132* 134*  K 4.0 4.5  CL 102 104  CO2 25 22  GLUCOSE 99 156*  BUN 11 15  CREATININE 1.23 1.23  CALCIUM 8.3* 8.5*   PT/INR Recent Labs    03/12/18 1830 03/14/18 0506  LABPROT 12.2 14.7  INR 0.91 1.16   ABG No results for input(s): PHART, HCO3 in the last 72 hours.  Invalid input(s): PCO2, PO2  Studies/Results: Dg Cholangiogram Operative  Result Date: 03/14/2018 CLINICAL DATA:  Intraoperative cholangiogram during laparoscopic cholecystectomy. EXAM: INTRAOPERATIVE CHOLANGIOGRAM FLUOROSCOPY TIME:  7 seconds COMPARISON:  Nuclear medicine HIDA scan-03/13/2018; right upper quadrant abdominal ultrasound-03/13/2018 FINDINGS: A single spot intra op cholangiographic image of the right upper abdominal quadrant during laparoscopic cholecystectomy are provided for review. Surgical clips overlie the expected location of the gallbladder fossa. Contrast injection demonstrates selective cannulation of the central aspect of the  cystic duct. There is passage of contrast through the central aspect of the cystic duct with filling of a non dilated common bile duct. There is passage of contrast though the CBD and into the descending portion of the duodenum. There is minimal reflux of injected contrast into the common hepatic duct and central aspect of the non dilated intrahepatic biliary system. There are no discrete filling defects within the opacified portions of the biliary system to suggest the presence of choledocholithiasis. IMPRESSION: No evidence of choledocholithiasis. Electronically Signed   By: Simonne Come M.D.   On: 03/14/2018 16:10   Nm Hepato W/eject Fract  Result Date: 03/13/2018 CLINICAL DATA:  Gallstones and gallbladder wall thickening. Right upper quadrant pain EXAM: NUCLEAR MEDICINE HEPATOBILIARY IMAGING TECHNIQUE: Sequential images of the abdomen were obtained out to 60 minutes following intravenous administration of radiopharmaceutical. RADIOPHARMACEUTICALS:  5.0 mCi Tc-41m  Choletec IV COMPARISON:  03/13/2018 FINDINGS: Prompt uptake and biliary excretion of activity by the liver is seen. In the first hour no gallbladder activity visualized. After 90 minutes morphine sulfate was administered intravenously and subsequent imaging was performed without gallbladder visualization. Biliary activity passes into small bowel, consistent with patent common bile duct. IMPRESSION: 1. Nonvisualization of the gallbladder compatible with cystic duct obstruction and acute cholecystitis. 2. Biliary activity passes into the small bowel consistent with patent common bile duct. Electronically Signed   By: Signa Kell M.D.   On: 03/13/2018 17:20   Ct Angio Chest Aorta W/cm &/or Wo/cm  Result Date: 03/13/2018 CLINICAL DATA:  Nausea and midline chest pain today. History of mediastinal and hilar lymphadenopathy. EXAM: CT ANGIOGRAPHY CHEST WITH  CONTRAST TECHNIQUE: Multidetector CT imaging of the chest was performed using the standard  protocol during bolus administration of intravenous contrast. Multiplanar CT image reconstructions and MIPs were obtained to evaluate the vascular anatomy. CONTRAST:  ISOVUE-370 IOPAMIDOL (ISOVUE-370) INJECTION 76% COMPARISON:  Chest CT 11/22/2012 and PET-CT 12/16/2012 FINDINGS: Cardiovascular: The heart is normal in size. No pericardial effusion. The aorta is normal in caliber. No atherosclerotic calcifications. No dissection. The branch vessels are patent. Scattered coronary artery calcifications are noted. The pulmonary arteries appear normal. No findings to suggest pulmonary embolism. Mediastinum/Nodes: Stable appearing mediastinal and hilar lymphadenopathy since 2014. 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 The esophagus is grossly normal. Lungs/Pleura: No acute pulmonary findings. No worrisome pulmonary lesions. Minimal dependent subpleural atelectasis. No findings to suggest interstitial lung disease or bronchiectasis. Upper Abdomen: No significant upper abdominal findings. The upper abdominal aorta is unremarkable and visualized branch vessels appear normal. Chest wall/musculoskeletal: No chest wall mass, supraclavicular or axillary lymphadenopathy. Minimal stable gynecomastia. The thyroid gland appears normal. The bony thorax is intact.  No bone lesions or fracture. Review of the MIP images confirms the above findings. IMPRESSION: 1. Normal CT appearance of the thoracic aorta. No aneurysm or dissection. 2. The pulmonary arteries appear normal. No findings for pulmonary embolism. 3. No acute pulmonary findings. 4. Stable symmetric bulky mediastinal and hilar lymphadenopathy since 2014, most likely due to sarcoidosis. Electronically Signed   By: Rudie Meyer M.D.   On: 03/13/2018 10:01   US Abdomen Limited Ruq  Result Date: 03/13/2018 CLINICAL DATA:  Right upper quadrant pain EXAM: ULTRASOUND ABDOMEN LIMITED RIGHT UPPER QUADRANT COMPARISON:  PET-CT December 16, 2012 FINDINGS: Gallbladder: Within  the gallbladder, there are multiple echogenic foci which move and shadow consistent with cholelithiasis. Largest gallstone measures 1.9 cm in length. Gallbladder wall is thickened and edematous. There is no appreciable pericholecystic fluid. No sonographic Murphy sign noted by sonographer. Common bile duct: Diameter: 3 mm. No intrahepatic or extrahepatic biliary duct dilatation. Liver: No focal lesion identified. Within normal limits in parenchymal echogenicity. Portal vein is patent on color Doppler imaging with normal direction of blood flow towards the liver. IMPRESSION: Gallstones with thickened, edematous gallbladder wall. These are findings felt to be indicative of acute cholecystitis. Study otherwise unremarkable. These results will be called to the ordering clinician or representative by the Radiologist Assistant, and communication documented in the PACS or zVision Dashboard. Electronically Signed   By: Bretta Bang III M.D.   On: 03/13/2018 12:31    Anti-infectives: Anti-infectives (From admission, onward)   Start     Dose/Rate Route Frequency Ordered Stop   03/13/18 1815  cefTRIAXone (ROCEPHIN) 2 g in sodium chloride 0.9 % 100 mL IVPB     2 g 200 mL/hr over 30 Minutes Intravenous Every 24 hours 03/13/18 1810     03/13/18 1730  cefTRIAXone (ROCEPHIN) 1 g in sodium chloride 0.9 % 100 mL IVPB  Status:  Discontinued     1 g 200 mL/hr over 30 Minutes Intravenous Every 24 hours 03/13/18 1724 03/13/18 1809      Assessment/Plan: s/p Procedure(s): LAPAROSCOPIC CHOLECYSTECTOMY WITH INTRAOPERATIVE CHOLANGIOGRAM (N/A) OK for discharge today from surgical standpoint. Discharge instructions reviewed with patient.  LOS: 1 day    Wynona Luna 03/15/2018

## 2018-04-09 ENCOUNTER — Inpatient Hospital Stay: Payer: Self-pay | Admitting: Internal Medicine

## 2018-04-15 ENCOUNTER — Inpatient Hospital Stay (INDEPENDENT_AMBULATORY_CARE_PROVIDER_SITE_OTHER): Payer: Self-pay | Admitting: Physician Assistant

## 2018-11-14 IMAGING — US US ABDOMEN LIMITED
1 series · 14 of 25 positions shown · non-contrast
Comparison: PET-CT December 16, 2012

CLINICAL DATA: Right upper quadrant pain

EXAM:
ULTRASOUND ABDOMEN LIMITED RIGHT UPPER QUADRANT

[Series 1: us abdomen limited · 0.22mm/px · 14 of 57 slices shown]
[im 1/57]
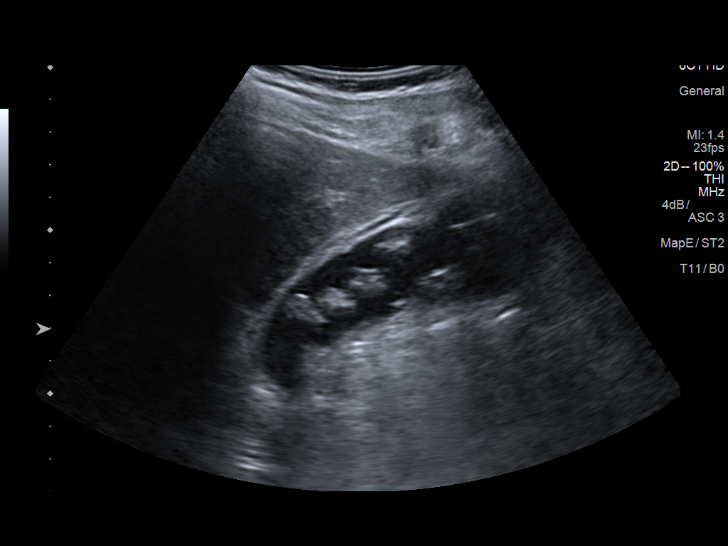
[im 5/57]
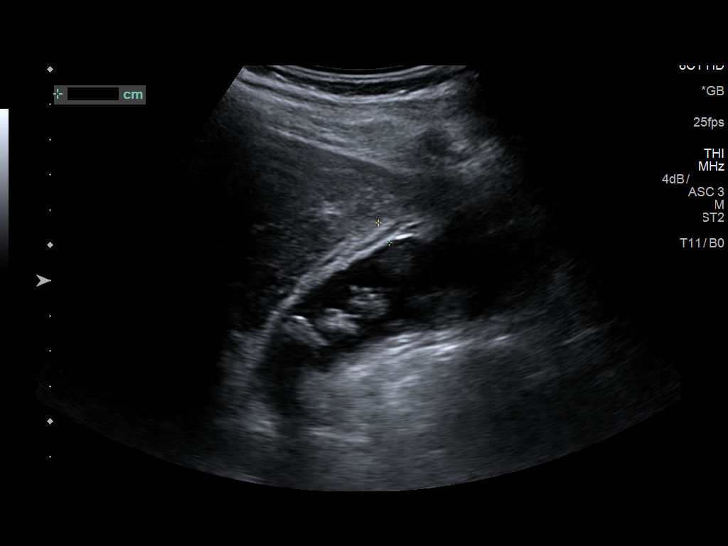
[im 10/57]
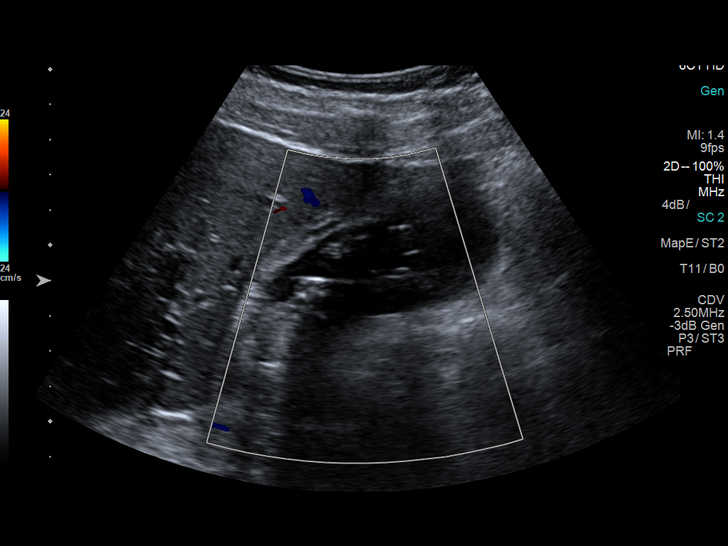
[im 15/57]
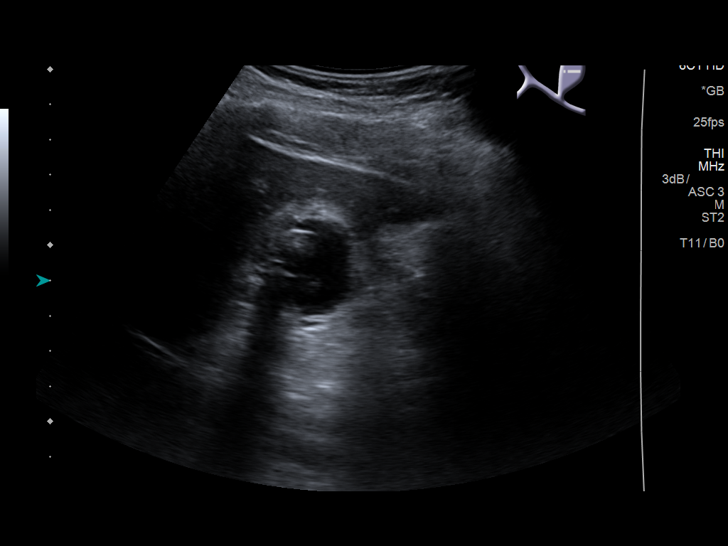
[im 19/57]
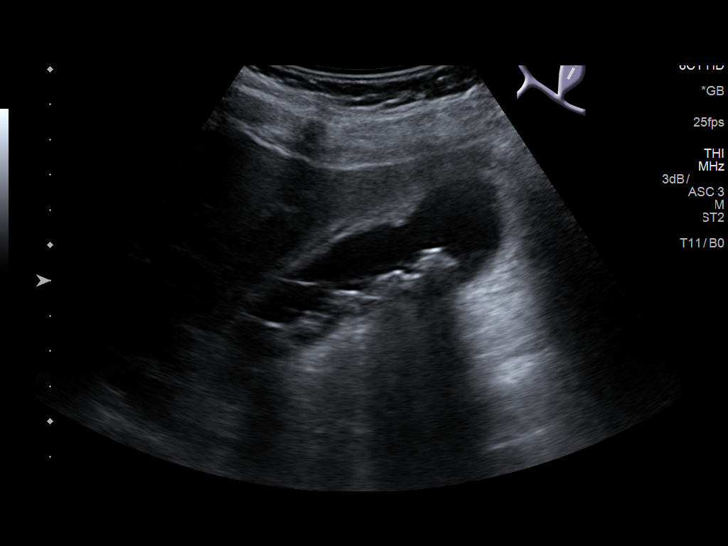
[im 22/57]
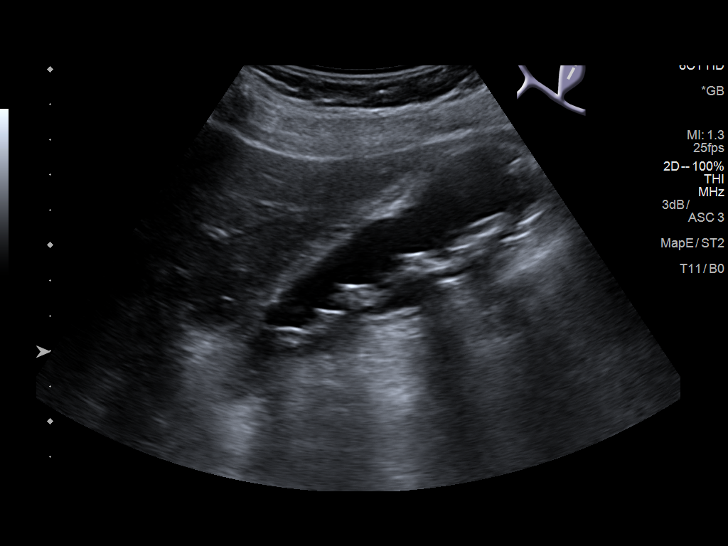
[im 26/57]
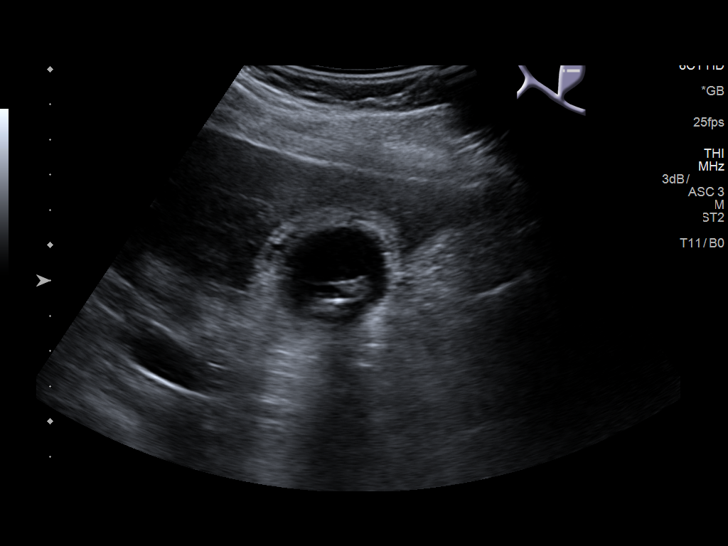
[im 31/57]
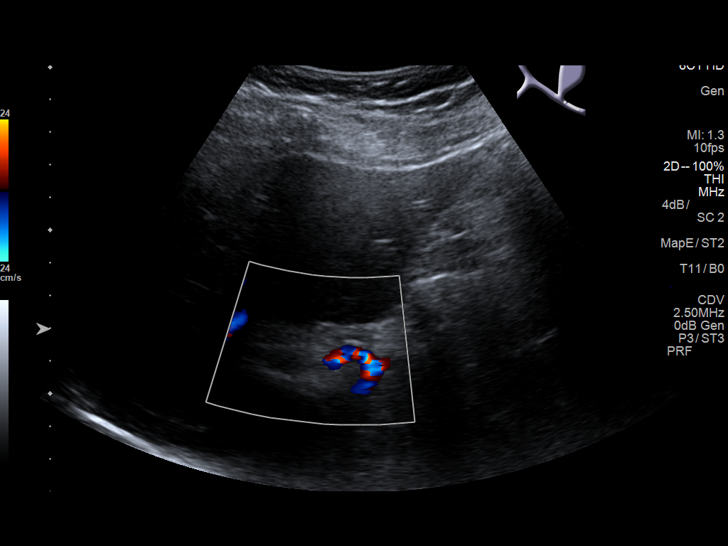
[im 36/57]
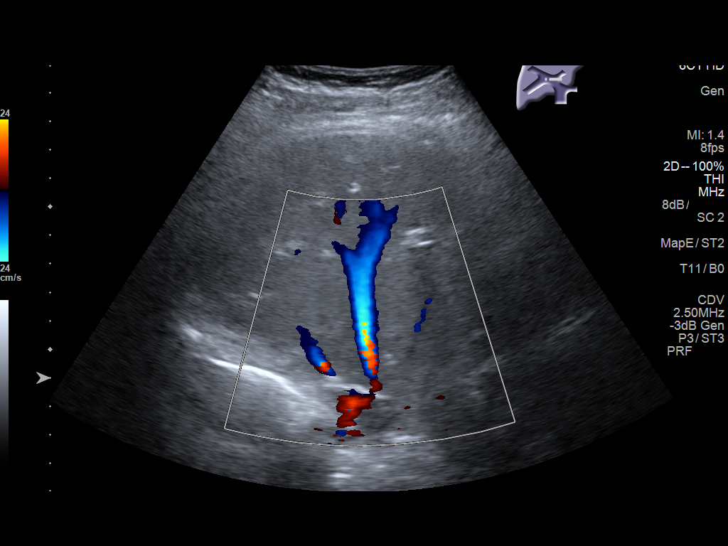
[im 38/57]
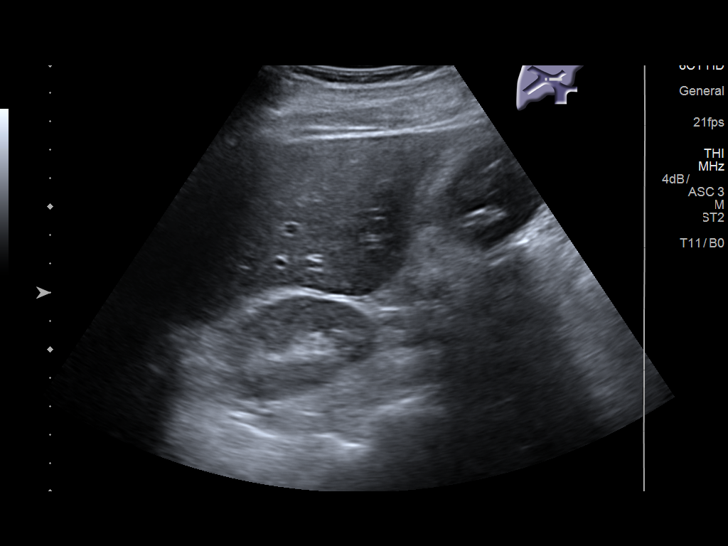
[im 43/57]
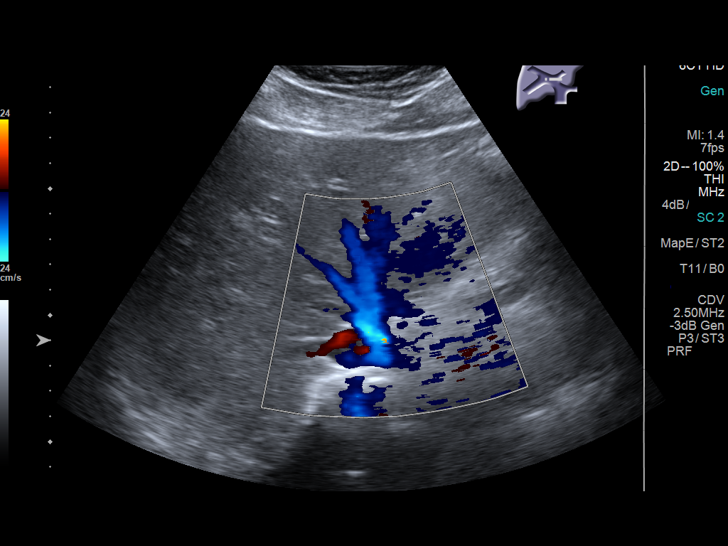
[im 47/57]
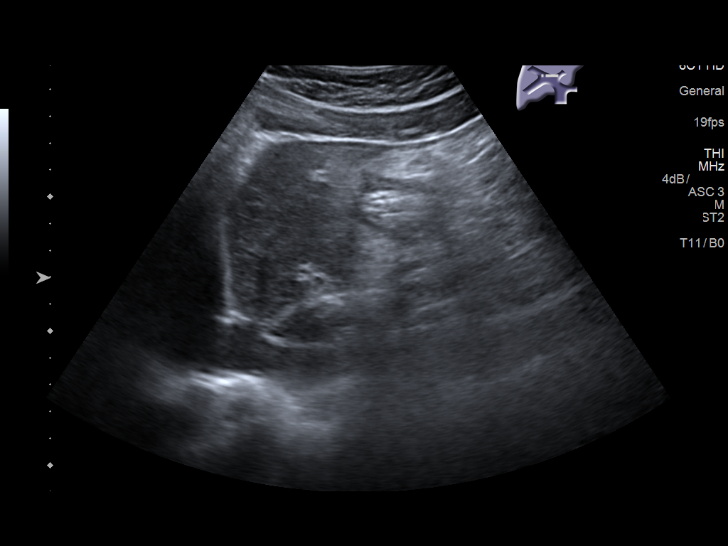
[im 52/57]
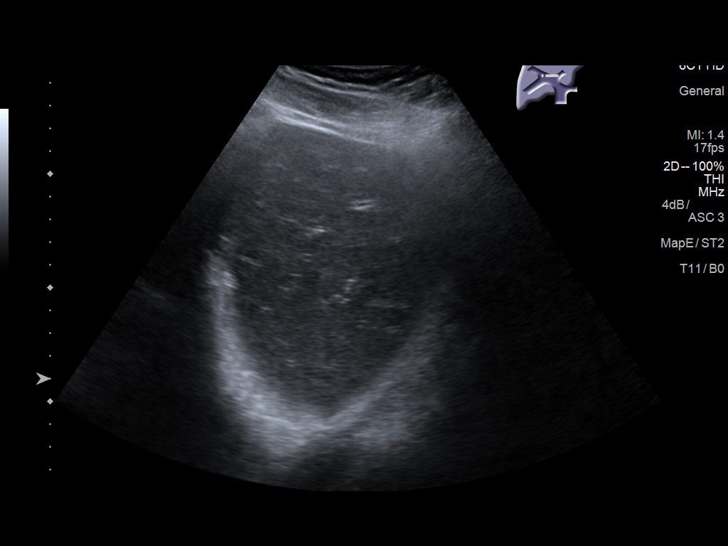
[im 57/57]
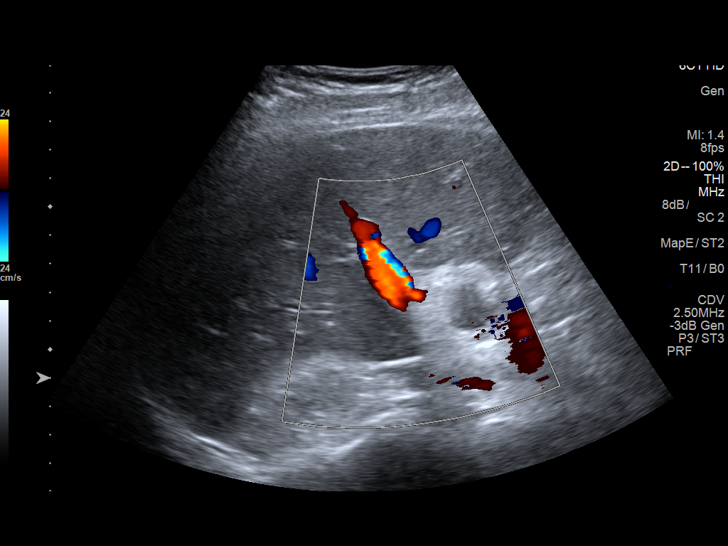

[14 of 25 positions shown; findings below may reference images not displayed]

FINDINGS: Gallbladder:

Within the gallbladder, there are multiple echogenic foci which move
and shadow consistent with cholelithiasis. Largest gallstone
measures 1.9 cm in length. Gallbladder wall is thickened and
edematous. There is no appreciable pericholecystic fluid.. No
sonographic Murphy sign noted by sonographer.

Common bile duct:

Diameter: 3 mm. No intrahepatic or extrahepatic biliary duct
dilatation.

Liver:

No focal lesion identified. Within normal limits in parenchymal
echogenicity. Portal vein is patent on color Doppler imaging with
normal direction of blood flow towards the liver.
IMPRESSION: Gallstones with thickened, edematous gallbladder wall. These are
findings felt to be indicative of acute cholecystitis.

Study otherwise unremarkable.

These results will be called to the ordering clinician or
representative by the Radiologist Assistant, and communication
documented in the PACS or zVision Dashboard.

## 2019-01-17 ENCOUNTER — Other Ambulatory Visit: Payer: Self-pay

## 2019-01-17 DIAGNOSIS — Z20822 Contact with and (suspected) exposure to covid-19: Secondary | ICD-10-CM

## 2019-01-18 LAB — NOVEL CORONAVIRUS, NAA: SARS-CoV-2, NAA: NOT DETECTED

## 2019-01-20 ENCOUNTER — Other Ambulatory Visit: Payer: Self-pay

## 2019-01-20 DIAGNOSIS — Z79899 Other long term (current) drug therapy: Secondary | ICD-10-CM | POA: Insufficient documentation

## 2019-01-20 DIAGNOSIS — Y929 Unspecified place or not applicable: Secondary | ICD-10-CM | POA: Insufficient documentation

## 2019-01-20 DIAGNOSIS — Y939 Activity, unspecified: Secondary | ICD-10-CM | POA: Insufficient documentation

## 2019-01-20 DIAGNOSIS — I129 Hypertensive chronic kidney disease with stage 1 through stage 4 chronic kidney disease, or unspecified chronic kidney disease: Secondary | ICD-10-CM | POA: Insufficient documentation

## 2019-01-20 DIAGNOSIS — N182 Chronic kidney disease, stage 2 (mild): Secondary | ICD-10-CM | POA: Insufficient documentation

## 2019-01-20 DIAGNOSIS — Y999 Unspecified external cause status: Secondary | ICD-10-CM | POA: Insufficient documentation

## 2019-01-20 DIAGNOSIS — S40012A Contusion of left shoulder, initial encounter: Secondary | ICD-10-CM | POA: Insufficient documentation

## 2019-01-20 DIAGNOSIS — W1830XA Fall on same level, unspecified, initial encounter: Secondary | ICD-10-CM | POA: Insufficient documentation

## 2019-01-21 ENCOUNTER — Encounter (HOSPITAL_COMMUNITY): Payer: Self-pay | Admitting: Family Medicine

## 2019-01-21 ENCOUNTER — Emergency Department (HOSPITAL_COMMUNITY): Payer: Self-pay

## 2019-01-21 ENCOUNTER — Emergency Department (HOSPITAL_COMMUNITY)
Admission: EM | Admit: 2019-01-21 | Discharge: 2019-01-21 | Disposition: A | Discharge status: Home / Self Care | Payer: Self-pay | Attending: Emergency Medicine | Admitting: Emergency Medicine

## 2019-01-21 DIAGNOSIS — S40012A Contusion of left shoulder, initial encounter: Secondary | ICD-10-CM

## 2019-01-21 MED ORDER — METHOCARBAMOL 500 MG PO TABS: 500.0000 mg | ORAL_TABLET | Freq: Two times a day (BID) | ORAL | 0 refills | Status: AC | PRN

## 2019-01-21 MED ORDER — NAPROXEN 500 MG PO TABS
500.0000 mg | ORAL_TABLET | Freq: Once | ORAL | Status: AC
  Administered 2019-01-21: 04:00:00 500 mg via ORAL
  Filled 2019-01-21: qty 1

## 2019-01-21 MED ORDER — NAPROXEN 500 MG PO TABS: 500.0000 mg | ORAL_TABLET | Freq: Two times a day (BID) | ORAL | 0 refills | Status: AC
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
## 2019-01-21 NOTE — Discharge Instructions (Signed)
Your x-ray did not show any concern for a broken bone or joint dislocation.  Alternate ice and heat to areas of injury 3-4 times per day to limit inflammation and spasm.  Avoid strenuous activity and heavy lifting.  We recommend consistent use of naproxen in addition to Robaxin for muscle spasms.  Do not drive or drink alcohol after taking Robaxin as it may make you drowsy and impair your judgment.  We recommend follow-up with a sports medicine doctor to ensure resolution of symptoms.  Return to the ED for any new or concerning symptoms.

## 2019-04-13 DEATH — deceased

## 2019-10-04 IMAGING — DX DG CHEST 2V
2 series · 2 of 2 positions shown · non-contrast
Comparison: PET CT 12/16/2012

CLINICAL DATA: Epigastric pain

EXAM:
CHEST - 2 VIEW

[chest pa]
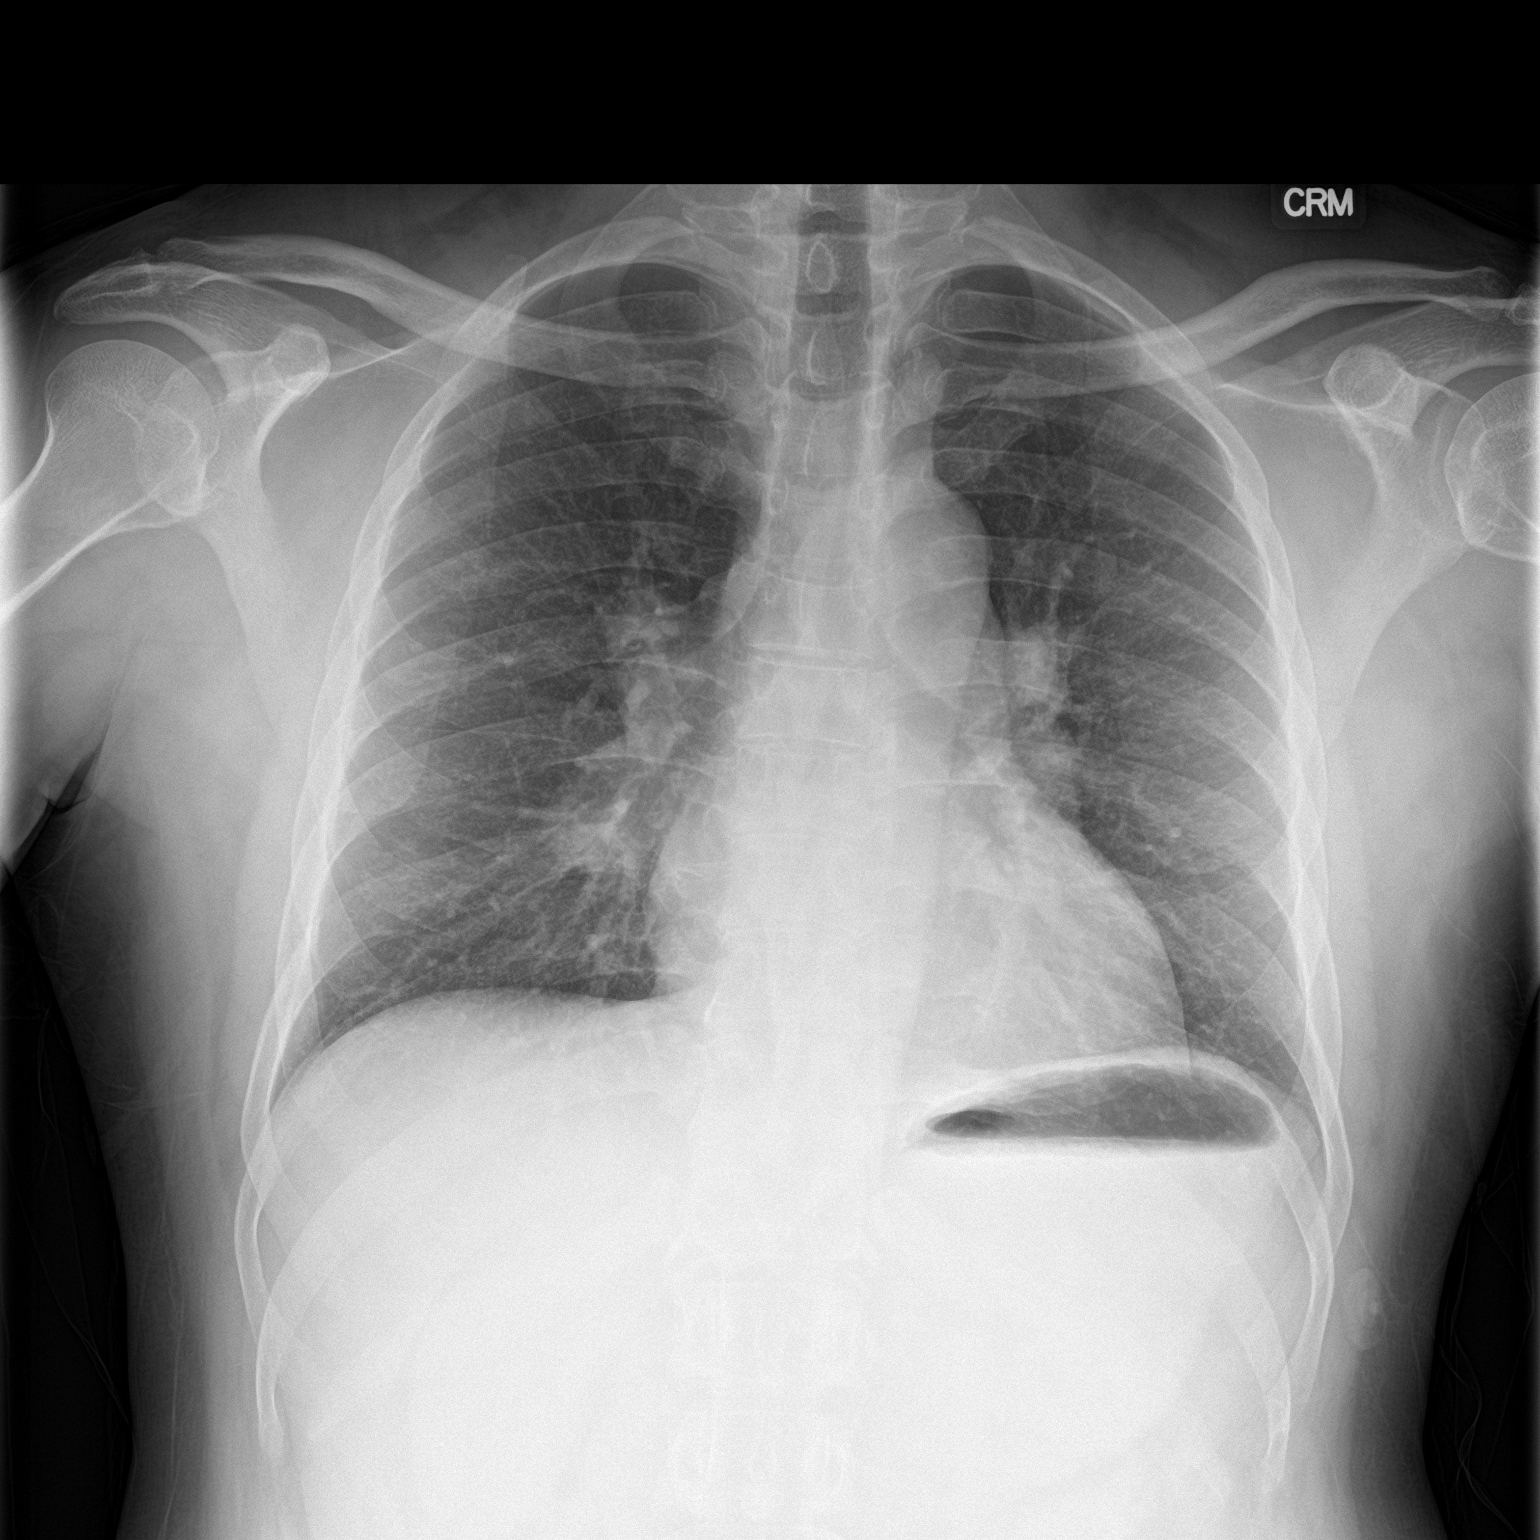

[chest lat]
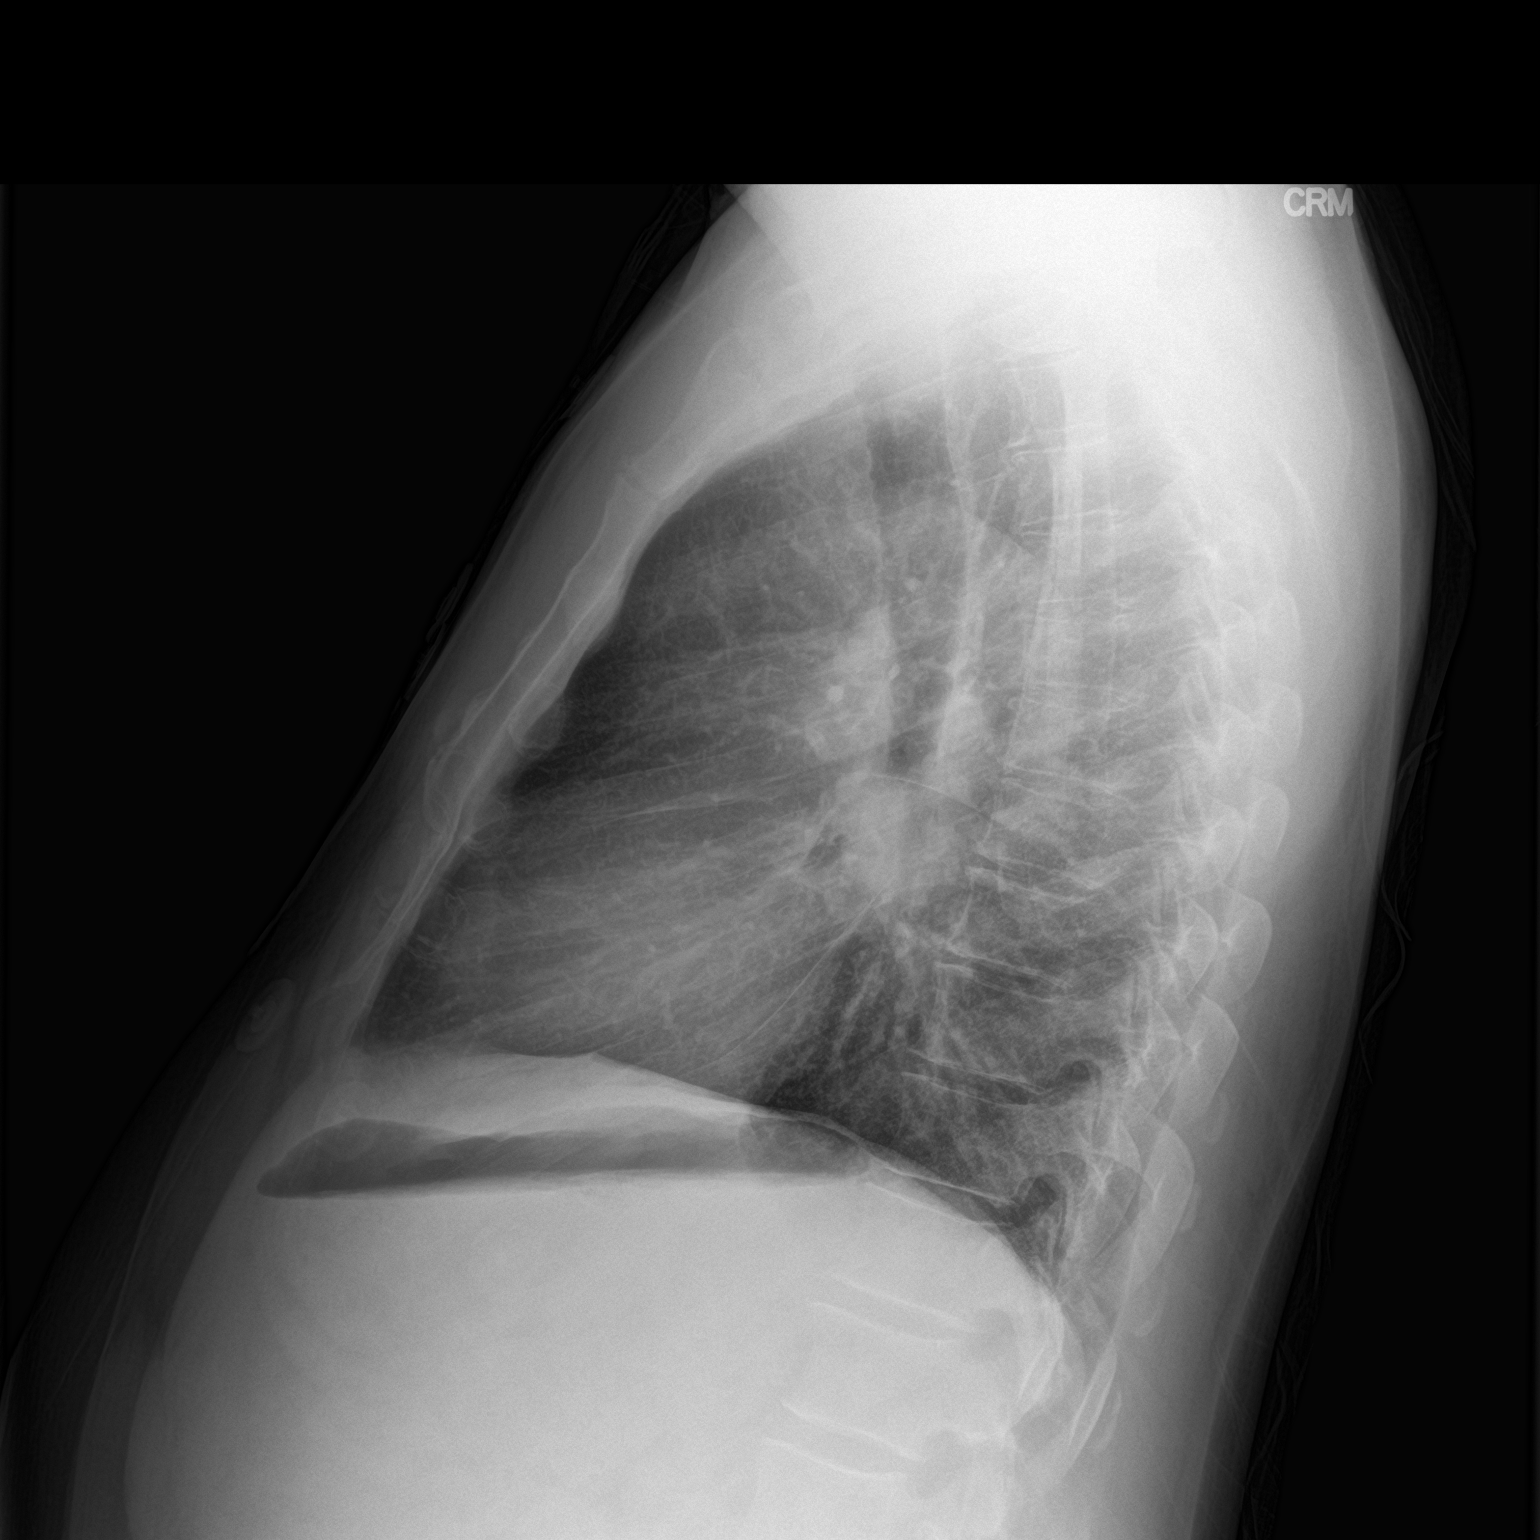

[2 of 2 positions shown; findings below may reference images not displayed]

FINDINGS: No focal opacity or pleural effusion. Normal heart size. Fullness in
the hilar regions likely represent nodes, slightly less prominent as
compared with prior radiograph. No pneumothorax
IMPRESSION: No active cardiopulmonary disease. Bilateral hilar fullness, likely
corresponds to lymphadenopathy. This appears slightly decreased as
compared with 3275 radiograph.

## 2019-10-05 IMAGING — NM NM HEPATO W/GB/PHARM/[PERSON_NAME]
4 series · 19 of 19 positions shown · non-contrast
Comparison: 03/13/2018

CLINICAL DATA: Gallstones and gallbladder wall thickening. Right
upper quadrant pain

EXAM:
NUCLEAR MEDICINE HEPATOBILIARY IMAGING
TECHNIQUE: Sequential images of the abdomen were obtained [DATE] minutes
following intravenous administration of radiopharmaceutical.
RADIOPHARMACEUTICALS:  5.0 mCi Nc-88m  Choletec IV

[Series 1: biliary · 4.14mm/px · 6 of 60 frames shown (1 of 2)]
[frame 6/60]
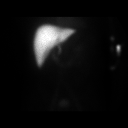
[frame 16/60]
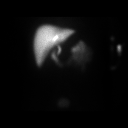
[frame 26/60]
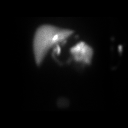
[frame 36/60]
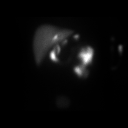
[frame 46/60]
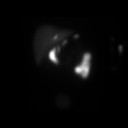
[frame 56/60]
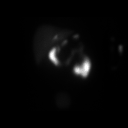

[Series 2: biliary · 4.14mm/px · 6 of 25 frames shown (2 of 2)]
[frame 3/25]
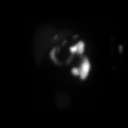
[frame 7/25]
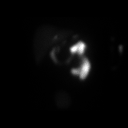
[frame 11/25]
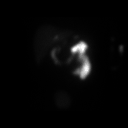
[frame 15/25]
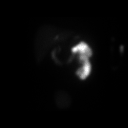
[frame 19/25]
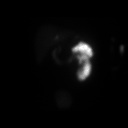
[frame 23/25]
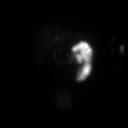

[Series 3: rt lat · 4.14mm/px · 1 of 1 slices shown]
[im 1/1]
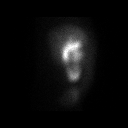

[Series 4: gbef · 4.14mm/px · 6 of 30 frames shown]
[frame 3/30]
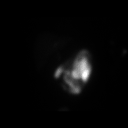
[frame 8/30]
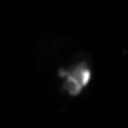
[frame 13/30]
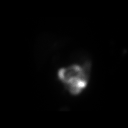
[frame 18/30]
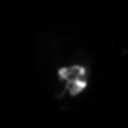
[frame 23/30]
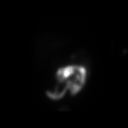
[frame 28/30]
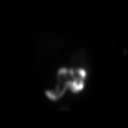

[19 of 19 positions shown; findings below may reference images not displayed]

FINDINGS: Prompt uptake and biliary excretion of activity by the liver is
seen. In the first hour no gallbladder activity visualized. After 90
minutes morphine sulfate was administered intravenously and
subsequent imaging was performed without gallbladder visualization.
Biliary activity passes into small bowel, consistent with patent
common bile duct.
IMPRESSION: 1. Nonvisualization of the gallbladder compatible with cystic duct
obstruction and acute cholecystitis.
2. Biliary activity passes into the small bowel consistent with
patent common bile duct.

## 2019-10-06 IMAGING — RF DG CHOLANGIOGRAM OPERATIVE
1 series · 1 of 1 positions shown · non-contrast
Comparison: Nuclear medicine HIDA scan-03/13/2018; right upper
quadrant abdominal ultrasound-03/13/2018

CLINICAL DATA: Intraoperative cholangiogram during laparoscopic
cholecystectomy.

EXAM:
INTRAOPERATIVE CHOLANGIOGRAM
FLUOROSCOPY TIME:  7 seconds

[Series 1: run · 1 of 1 slices shown]
[im 1/1]
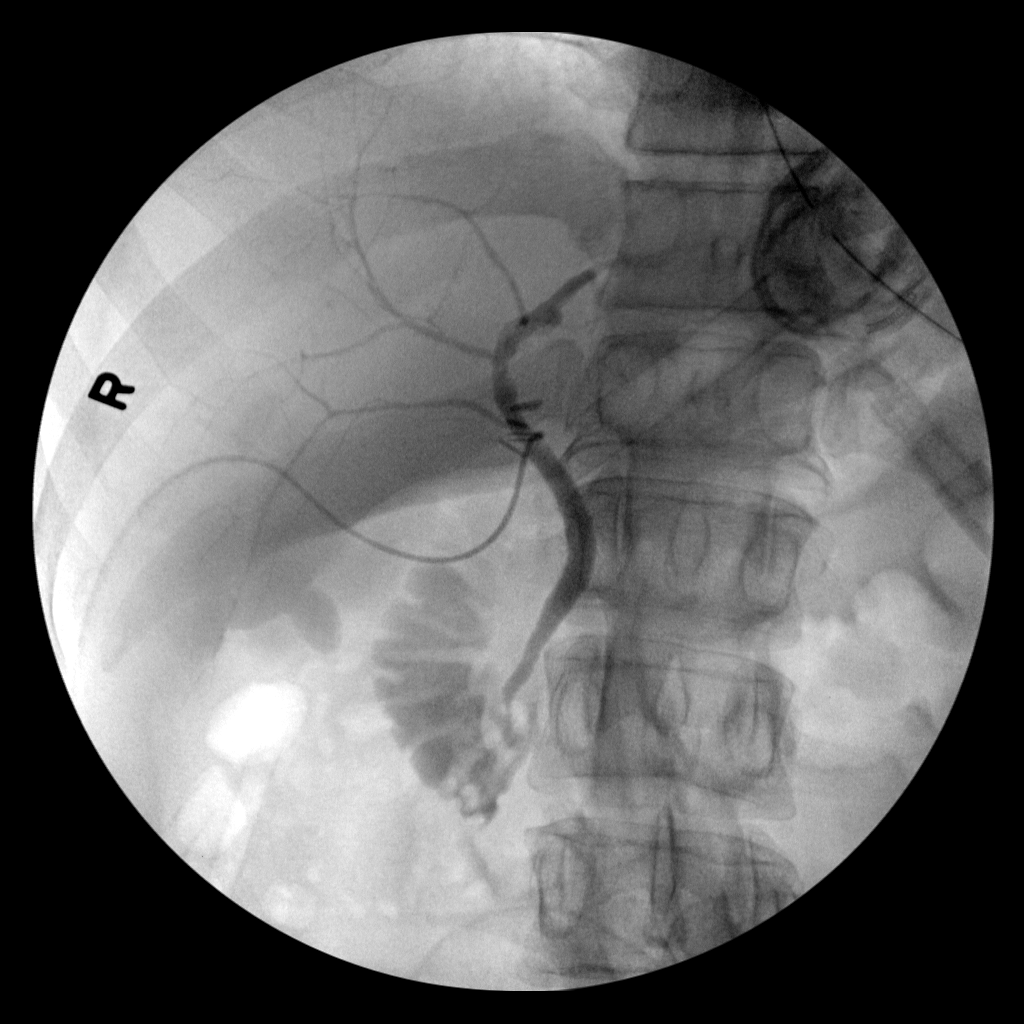

[1 of 1 positions shown; findings below may reference images not displayed]

FINDINGS: A single spot intra op cholangiographic image of the right upper
abdominal quadrant during laparoscopic cholecystectomy are provided
for review.

Surgical clips overlie the expected location of the gallbladder
fossa.

Contrast injection demonstrates selective cannulation of the central
aspect of the cystic duct.

There is passage of contrast through the central aspect of the
cystic duct with filling of a non dilated common bile duct. There is
passage of contrast though the CBD and into the descending portion
of the duodenum.

There is minimal reflux of injected contrast into the common hepatic
duct and central aspect of the non dilated intrahepatic biliary
system.

There are no discrete filling defects within the opacified portions
of the biliary system to suggest the presence of
choledocholithiasis.
IMPRESSION: No evidence of choledocholithiasis.
